# Patient Record
Sex: Male | Born: 1955 | Race: White | Hispanic: No | Marital: Married | Smoking: Never smoker
Health system: Southern US, Community
[De-identification: ages and names within clinical notes are randomized; demographics above are authoritative.]

## PROBLEM LIST (undated history)

## (undated) DIAGNOSIS — E785 Hyperlipidemia, unspecified: Secondary | ICD-10-CM

## (undated) DIAGNOSIS — D352 Benign neoplasm of pituitary gland: Secondary | ICD-10-CM

## (undated) DIAGNOSIS — G4730 Sleep apnea, unspecified: Secondary | ICD-10-CM

## (undated) DIAGNOSIS — T7840XA Allergy, unspecified, initial encounter: Secondary | ICD-10-CM

## (undated) DIAGNOSIS — F419 Anxiety disorder, unspecified: Secondary | ICD-10-CM

## (undated) DIAGNOSIS — K219 Gastro-esophageal reflux disease without esophagitis: Secondary | ICD-10-CM

## (undated) DIAGNOSIS — Z8639 Personal history of other endocrine, nutritional and metabolic disease: Secondary | ICD-10-CM

## (undated) DIAGNOSIS — I10 Essential (primary) hypertension: Secondary | ICD-10-CM

## (undated) DIAGNOSIS — N529 Male erectile dysfunction, unspecified: Secondary | ICD-10-CM

## (undated) DIAGNOSIS — D126 Benign neoplasm of colon, unspecified: Secondary | ICD-10-CM

## (undated) DIAGNOSIS — C439 Malignant melanoma of skin, unspecified: Secondary | ICD-10-CM

## (undated) HISTORY — PX: KNEE ARTHROSCOPY: SUR90

## (undated) HISTORY — DX: Personal history of other endocrine, nutritional and metabolic disease: Z86.39

## (undated) HISTORY — DX: Hyperlipidemia, unspecified: E78.5

## (undated) HISTORY — DX: Benign neoplasm of pituitary gland: D35.2

## (undated) HISTORY — DX: Malignant melanoma of skin, unspecified: C43.9

## (undated) HISTORY — DX: Male erectile dysfunction, unspecified: N52.9

## (undated) HISTORY — DX: Benign neoplasm of colon, unspecified: D12.6

## (undated) HISTORY — PX: WISDOM TOOTH EXTRACTION: SHX21

## (undated) HISTORY — DX: Essential (primary) hypertension: I10

## (undated) HISTORY — DX: Anxiety disorder, unspecified: F41.9

## (undated) HISTORY — DX: Gastro-esophageal reflux disease without esophagitis: K21.9

## (undated) HISTORY — DX: Sleep apnea, unspecified: G47.30

## (undated) HISTORY — DX: Allergy, unspecified, initial encounter: T78.40XA

## (undated) HISTORY — PX: SKIN LESION EXCISION: SHX2412

---

## 2002-05-27 DIAGNOSIS — D126 Benign neoplasm of colon, unspecified: Secondary | ICD-10-CM

## 2002-05-27 HISTORY — DX: Benign neoplasm of colon, unspecified: D12.6

## 2003-05-14 ENCOUNTER — Encounter: Payer: Self-pay | Admitting: Pulmonary Disease

## 2003-05-14 ENCOUNTER — Ambulatory Visit (HOSPITAL_BASED_OUTPATIENT_CLINIC_OR_DEPARTMENT_OTHER): Admission: RE | Admit: 2003-05-14 | Discharge: 2003-05-14 | Payer: Self-pay | Admitting: *Deleted

## 2003-07-09 ENCOUNTER — Ambulatory Visit (HOSPITAL_BASED_OUTPATIENT_CLINIC_OR_DEPARTMENT_OTHER): Admission: RE | Admit: 2003-07-09 | Discharge: 2003-07-09 | Payer: Self-pay | Admitting: *Deleted

## 2003-08-06 ENCOUNTER — Encounter: Payer: Self-pay | Admitting: Pulmonary Disease

## 2004-04-15 ENCOUNTER — Ambulatory Visit (HOSPITAL_BASED_OUTPATIENT_CLINIC_OR_DEPARTMENT_OTHER): Admission: RE | Admit: 2004-04-15 | Discharge: 2004-04-15 | Payer: Self-pay | Admitting: *Deleted

## 2004-04-15 ENCOUNTER — Encounter (INDEPENDENT_AMBULATORY_CARE_PROVIDER_SITE_OTHER): Payer: Self-pay | Admitting: Specialist

## 2004-04-15 ENCOUNTER — Ambulatory Visit (HOSPITAL_COMMUNITY): Admission: RE | Admit: 2004-04-15 | Discharge: 2004-04-15 | Payer: Self-pay | Admitting: *Deleted

## 2004-12-03 ENCOUNTER — Ambulatory Visit: Payer: Self-pay | Admitting: Family Medicine

## 2005-02-19 ENCOUNTER — Ambulatory Visit: Payer: Self-pay | Admitting: Family Medicine

## 2005-06-25 ENCOUNTER — Ambulatory Visit: Payer: Self-pay | Admitting: Gastroenterology

## 2005-07-09 ENCOUNTER — Ambulatory Visit: Payer: Self-pay | Admitting: Gastroenterology

## 2005-07-09 ENCOUNTER — Encounter (INDEPENDENT_AMBULATORY_CARE_PROVIDER_SITE_OTHER): Payer: Self-pay | Admitting: *Deleted

## 2005-11-23 ENCOUNTER — Ambulatory Visit: Payer: Self-pay | Admitting: Family Medicine

## 2005-12-16 ENCOUNTER — Ambulatory Visit: Payer: Self-pay | Admitting: Family Medicine

## 2005-12-27 HISTORY — PX: TONSILLECTOMY: SUR1361

## 2005-12-27 HISTORY — PX: SEPTOPLASTY: SUR1290

## 2006-05-11 ENCOUNTER — Ambulatory Visit (HOSPITAL_COMMUNITY): Admission: RE | Admit: 2006-05-11 | Discharge: 2006-05-12 | Payer: Self-pay | Admitting: Otolaryngology

## 2006-05-11 ENCOUNTER — Encounter (INDEPENDENT_AMBULATORY_CARE_PROVIDER_SITE_OTHER): Payer: Self-pay | Admitting: Specialist

## 2006-07-28 ENCOUNTER — Encounter: Payer: Self-pay | Admitting: Pulmonary Disease

## 2006-07-28 ENCOUNTER — Ambulatory Visit (HOSPITAL_BASED_OUTPATIENT_CLINIC_OR_DEPARTMENT_OTHER): Admission: RE | Admit: 2006-07-28 | Discharge: 2006-07-28 | Payer: Self-pay | Admitting: Otolaryngology

## 2006-07-30 ENCOUNTER — Ambulatory Visit: Payer: Self-pay | Admitting: Internal Medicine

## 2007-02-10 ENCOUNTER — Ambulatory Visit: Payer: Self-pay | Admitting: Family Medicine

## 2007-02-10 LAB — CONVERTED CEMR LAB
ALT: 25 units/L (ref 0–40)
AST: 27 units/L (ref 0–37)
BUN: 15 mg/dL (ref 6–23)
Basophils Absolute: 0 10*3/uL (ref 0.0–0.1)
Basophils Relative: 0.5 % (ref 0.0–1.0)
Calcium: 9.5 mg/dL (ref 8.4–10.5)
Creatinine, Ser: 0.9 mg/dL (ref 0.4–1.5)
GFR calc non Af Amer: 95 mL/min
HCT: 46.4 % (ref 39.0–52.0)
HDL: 41.1 mg/dL (ref >39.0)
Hemoglobin: 16 g/dL (ref 13.0–17.0)
LDL Cholesterol: 80 mg/dL (ref 0–99)
Lymphocytes Relative: 29.5 % (ref 12.0–46.0)
MCHC: 34.5 g/dL (ref 30.0–36.0)
Monocytes Absolute: 0.6 10*3/uL (ref 0.2–0.7)
Neutrophils Relative %: 56.7 % (ref 43.0–77.0)
Platelets: 271 10*3/uL (ref 150–400)
Potassium: 4 meq/L (ref 3.5–5.1)
Total Bilirubin: 0.9 mg/dL (ref 0.3–1.2)
Total Protein: 7.3 g/dL (ref 6.0–8.3)
VLDL: 13 mg/dL (ref 0–40)
WBC: 5.7 10*3/uL (ref 4.5–10.5)

## 2008-04-25 ENCOUNTER — Telehealth: Payer: Self-pay | Admitting: Family Medicine

## 2008-05-02 ENCOUNTER — Ambulatory Visit: Payer: Self-pay | Admitting: Family Medicine

## 2008-05-02 DIAGNOSIS — I10 Essential (primary) hypertension: Secondary | ICD-10-CM | POA: Insufficient documentation

## 2008-05-02 DIAGNOSIS — J309 Allergic rhinitis, unspecified: Secondary | ICD-10-CM

## 2008-05-02 DIAGNOSIS — K219 Gastro-esophageal reflux disease without esophagitis: Secondary | ICD-10-CM

## 2008-05-02 DIAGNOSIS — E785 Hyperlipidemia, unspecified: Secondary | ICD-10-CM | POA: Insufficient documentation

## 2008-07-16 ENCOUNTER — Ambulatory Visit: Payer: Self-pay | Admitting: Gastroenterology

## 2008-07-30 ENCOUNTER — Ambulatory Visit: Payer: Self-pay | Admitting: Gastroenterology

## 2008-12-06 ENCOUNTER — Ambulatory Visit: Payer: Self-pay | Admitting: Family Medicine

## 2008-12-06 DIAGNOSIS — J209 Acute bronchitis, unspecified: Secondary | ICD-10-CM

## 2009-03-24 ENCOUNTER — Encounter: Payer: Self-pay | Admitting: Family Medicine

## 2009-04-10 ENCOUNTER — Emergency Department (HOSPITAL_COMMUNITY): Admission: EM | Admit: 2009-04-10 | Discharge: 2009-04-10 | Payer: Self-pay | Admitting: Emergency Medicine

## 2009-05-28 ENCOUNTER — Encounter: Payer: Self-pay | Admitting: Pulmonary Disease

## 2009-06-20 ENCOUNTER — Ambulatory Visit: Payer: Self-pay | Admitting: Pulmonary Disease

## 2009-06-20 DIAGNOSIS — G4733 Obstructive sleep apnea (adult) (pediatric): Secondary | ICD-10-CM

## 2009-08-13 ENCOUNTER — Ambulatory Visit: Payer: Self-pay | Admitting: Family Medicine

## 2009-08-13 DIAGNOSIS — L738 Other specified follicular disorders: Secondary | ICD-10-CM

## 2009-08-13 DIAGNOSIS — E669 Obesity, unspecified: Secondary | ICD-10-CM

## 2009-08-13 DIAGNOSIS — F411 Generalized anxiety disorder: Secondary | ICD-10-CM

## 2009-08-15 ENCOUNTER — Ambulatory Visit: Payer: Self-pay | Admitting: Family Medicine

## 2009-08-15 LAB — CONVERTED CEMR LAB
Glucose, Urine, Semiquant: NEGATIVE
Specific Gravity, Urine: 1.02
WBC Urine, dipstick: NEGATIVE

## 2009-08-19 LAB — CONVERTED CEMR LAB
ALT: 22 units/L (ref 0–53)
Albumin: 4 g/dL (ref 3.5–5.2)
Basophils Absolute: 0 10*3/uL (ref 0.0–0.1)
CO2: 33 meq/L — ABNORMAL HIGH (ref 19–32)
Calcium: 9.1 mg/dL (ref 8.4–10.5)
Cholesterol: 124 mg/dL (ref 0–200)
Eosinophils Absolute: 0.1 10*3/uL (ref 0.0–0.7)
Eosinophils Relative: 2 % (ref 0.0–5.0)
HDL: 36.1 mg/dL — ABNORMAL LOW (ref >39.00)
Hemoglobin: 14.7 g/dL (ref 13.0–17.0)
Lymphs Abs: 1.5 10*3/uL (ref 0.7–4.0)
MCV: 92.8 fL (ref 78.0–100.0)
Monocytes Absolute: 0.6 10*3/uL (ref 0.1–1.0)
Neutrophils Relative %: 67.9 % (ref 43.0–77.0)
Potassium: 4.4 meq/L (ref 3.5–5.1)
RDW: 12.6 % (ref 11.5–14.6)
Sodium: 147 meq/L — ABNORMAL HIGH (ref 135–145)
Total Bilirubin: 0.9 mg/dL (ref 0.3–1.2)
Triglycerides: 85 mg/dL (ref 0.0–149.0)

## 2009-08-20 ENCOUNTER — Ambulatory Visit: Payer: Self-pay | Admitting: Family Medicine

## 2009-09-11 ENCOUNTER — Encounter: Payer: Self-pay | Admitting: Family Medicine

## 2009-09-11 ENCOUNTER — Encounter: Admission: RE | Admit: 2009-09-11 | Discharge: 2009-09-11 | Payer: Self-pay | Admitting: Family Medicine

## 2009-09-29 ENCOUNTER — Encounter: Payer: Self-pay | Admitting: Family Medicine

## 2010-01-03 ENCOUNTER — Encounter: Payer: Self-pay | Admitting: Pulmonary Disease

## 2010-01-15 ENCOUNTER — Telehealth: Payer: Self-pay | Admitting: Family Medicine

## 2010-01-15 ENCOUNTER — Encounter: Payer: Self-pay | Admitting: Pulmonary Disease

## 2010-02-20 ENCOUNTER — Telehealth: Payer: Self-pay | Admitting: Family Medicine

## 2010-06-19 ENCOUNTER — Ambulatory Visit: Payer: Self-pay | Admitting: Pulmonary Disease

## 2010-08-12 ENCOUNTER — Ambulatory Visit: Payer: Self-pay | Admitting: Family Medicine

## 2010-08-12 DIAGNOSIS — R079 Chest pain, unspecified: Secondary | ICD-10-CM | POA: Insufficient documentation

## 2010-08-13 ENCOUNTER — Ambulatory Visit: Payer: Self-pay | Admitting: Cardiovascular Disease

## 2010-08-14 LAB — CONVERTED CEMR LAB
Alkaline Phosphatase: 61 units/L (ref 39–117)
Basophils Absolute: 0 10*3/uL (ref 0.0–0.1)
Bilirubin, Direct: 0.2 mg/dL (ref 0.0–0.3)
Chloride: 103 meq/L (ref 96–112)
Creatinine, Ser: 1.1 mg/dL (ref 0.4–1.5)
Eosinophils Absolute: 0.1 10*3/uL (ref 0.0–0.7)
Glucose, Bld: 74 mg/dL (ref 70–99)
HCT: 43.3 % (ref 39.0–52.0)
Hemoglobin: 14.8 g/dL (ref 13.0–17.0)
Lymphs Abs: 1.3 10*3/uL (ref 0.7–4.0)
MCHC: 34.1 g/dL (ref 30.0–36.0)
Neutrophils Relative %: 57.3 % (ref 43.0–77.0)
Platelets: 229 10*3/uL (ref 150.0–400.0)
Sodium: 144 meq/L (ref 135–145)
TSH: 2.04 microintl units/mL (ref 0.35–5.50)
Total Bilirubin: 0.6 mg/dL (ref 0.3–1.2)
Total Protein: 6.5 g/dL (ref 6.0–8.3)
WBC: 5.4 10*3/uL (ref 4.5–10.5)

## 2010-11-30 ENCOUNTER — Ambulatory Visit: Payer: Self-pay | Admitting: Family Medicine

## 2011-01-24 LAB — CONVERTED CEMR LAB
ALT: 19 units/L (ref 0–53)
AST: 21 units/L (ref 0–37)
Bilirubin, Direct: 0.1 mg/dL (ref 0.0–0.3)
Blood in Urine, dipstick: NEGATIVE
Chloride: 106 meq/L (ref 96–112)
Cholesterol: 198 mg/dL (ref 0–200)
Eosinophils Absolute: 0.2 10*3/uL (ref 0.0–0.7)
GFR calc non Af Amer: 75 mL/min
Glucose, Bld: 81 mg/dL (ref 70–99)
Hemoglobin: 15.6 g/dL (ref 13.0–17.0)
LDL Cholesterol: 148 mg/dL — ABNORMAL HIGH (ref 0–99)
Lymphocytes Relative: 23.4 % (ref 12.0–46.0)
Monocytes Relative: 10.2 % (ref 3.0–12.0)
Neutro Abs: 4.2 10*3/uL (ref 1.4–7.7)
PSA: 0.18 ng/mL (ref 0.10–4.00)
Platelets: 241 10*3/uL (ref 150–400)
Potassium: 4.2 meq/L (ref 3.5–5.1)
RBC: 5.01 M/uL (ref 4.22–5.81)
RDW: 12.5 % (ref 11.5–14.6)
Sodium: 143 meq/L (ref 135–145)
Specific Gravity, Urine: 1.015
TSH: 1.3 microintl units/mL (ref 0.35–5.50)
Total Bilirubin: 1.2 mg/dL (ref 0.3–1.2)
Total CHOL/HDL Ratio: 5.5
Total Protein: 7.2 g/dL (ref 6.0–8.3)
Triglycerides: 70 mg/dL (ref 0–149)
WBC Urine, dipstick: NEGATIVE

## 2011-01-26 NOTE — Progress Notes (Signed)
Summary: med refill  Phone Note Call from Patient Call back at Home Phone [phone]   Summary of Call: pt needs additional  refills  on sertraline 100 mg call into [facility] [phone] Initial call taken by: Heron Sabins,  January 15, 2010 9:18 AM  Follow-up for Phone Call        call in #30 with 11 rf Follow-up by: Nelwyn Salisbury MD,  January 15, 2010 12:30 PM  Additional Follow-up for Phone Call Additional follow up Details #1::        Phone Call Completed, Rx Called In Additional Follow-up by: Alfred Levins, CMA,  January 15, 2010 12:39 PM    Prescriptions: ZOLOFT 100 MG TABS (SERTRALINE HCL) once daily  #30 x 11   Entered by:   Alfred Levins, CMA   Authorized by:   Nelwyn Salisbury MD   Signed by:   Alfred Levins, CMA on 01/15/2010   Method used:   Electronically to        [facility] (retail)       [address]       [place], [place]  [address]       Ph: [phone]       Fax: [phone]   RxID:   [id number]

## 2011-01-26 NOTE — Miscellaneous (Signed)
Summary: optimal pressure 13cm  Clinical Lists Changes  Orders: Added new Referral order of DME Referral (DME) - Signed auto shows good compliance, optimal pressure 13-14cm

## 2011-01-26 NOTE — Assessment & Plan Note (Signed)
Summary: cramping in neck and legs/hurts to take deep breaths/pain in ...   Vital Signs:  Patient profile:   55 year old male Weight:      246 pounds BMI:     36.46 O2 Sat:      92 % on Room air Temp:     98.6 degrees F oral BP sitting:   112 / 72  (left arm) Cuff size:   regular  Vitals Entered By: Raechel Ache, RN (August 12, 2010 11:47 AM)  O2 Flow:  Room air CC: C/o L chest hurting under ribs past few weeks with deep breath and now L shoulder blade area hurts- feels tired and neck, calf muscles tight.   History of Present Illness: Here for 3 weeks of sharp pains in the left middle back and left chest area. No SOB but it hurts to take a deep breath. No fever or cough. No nausea or heartburn. No rashes. No recent trauma. he is concerned because he had a melanoma removed from  the left upper back.  Allergies: No Known Drug Allergies  Past History:  Past Medical History: Reviewed history from 08/20/2009 and no changes required. Allergic rhinitis GERD Hyperlipidemia sleep apnea, wears CPAP at night, has seen Dr. Shelle Iron hypotestosteronemia ED Hypertension melanoma, sees Dr. Terri Piedra for skin checks anxiety, sees Clerance Lav NP   Past Surgical History: Reviewed history from 08/20/2009 and no changes required. Knee arthroscopy Colonoscopy 07-30-08 per Dr. Russella Dar, repeat in 5 yrs septoplasty, tonsillectomy, and UPP per Dr. Annalee Genta 2007  Review of Systems  The patient denies anorexia, fever, weight loss, weight gain, vision loss, decreased hearing, hoarseness, syncope, dyspnea on exertion, peripheral edema, prolonged cough, headaches, hemoptysis, abdominal pain, melena, hematochezia, severe indigestion/heartburn, hematuria, incontinence, genital sores, muscle weakness, suspicious skin lesions, transient blindness, difficulty walking, depression, unusual weight change, abnormal bleeding, enlarged lymph nodes, angioedema, breast masses, and testicular masses.    Physical  Exam  General:  Well-developed,well-nourished,in no acute distress; alert,appropriate and cooperative throughout examination Neck:  No deformities, masses, or tenderness noted. Chest Wall:  tender along the left chest area just below the nipple line. This tenderness extends from the anterior chest around the flank to the scapula.  Lungs:  Normal respiratory effort, chest expands symmetrically. Lungs are clear to auscultation, no crackles or wheezes. Heart:  Normal rate and regular rhythm. S1 and S2 normal without gallop, murmur, click, rub or other extra sounds. Msk:  tender in the left upper back from the scapula to the spine. No masses.  Skin:  Intact without suspicious lesions or rashes   Impression & Recommendations:  Problem # 1:  CHEST PAIN (ICD-786.50)  Orders: Venipuncture ([address]109) TLB-BMP (Basic Metabolic Panel-BMET) (80048-METABOL) TLB-CBC Platelet - w/Differential (85025-CBCD) TLB-Hepatic/Liver Function Pnl (80076-HEPATIC) TLB-TSH (Thyroid Stimulating Hormone) (60454-UJW) T-D-Dimer Fibrin Derivatives Quantitive (11914-78295) Specimen Handling (62130) Radiology Referral (Radiology)  Complete Medication List: 1)  Lipitor 40 Mg Tabs (Atorvastatin calcium) .... Take 1 tab by mouth daily 2)  Multivitamins Tabs (Multiple vitamin) .Marland Kitchen.. 1 by mouth once daily 3)  Fish Oil Oil (Fish oil) .Marland Kitchen.. 1 by mouth once daily 4)  Zoloft 100 Mg Tabs (Sertraline hcl) .... Once daily  Patient Instructions: 1)  This is likely musculoskeletal pain, but we are both concerned about his hx of melanoma. We need to rule out PE, etc. also. Get labs and set up a contrasted chest CT Prescriptions: LIPITOR 40 MG TABS (ATORVASTATIN CALCIUM) Take 1 tab by mouth daily  #90 x 3  Entered and Authorized by:   Nelwyn Salisbury MD   Signed by:   Nelwyn Salisbury MD on 08/12/2010   Method used:   Print then Give to Patient   RxID:   [id number]

## 2011-01-26 NOTE — Assessment & Plan Note (Signed)
Summary: rov for osa   Copy to:  Dr. Annalee Genta Primary Provider/Referring Provider:  Clent Ridges  CC:  Pt is here for a 1 yr f/u appt on her OSA.   Pt states he is wearing his cpap machine every night.  Approx 8 hours per night.  Pt states he was prev on a nasal mask but DME company recently sent him a full face mask.  pt states he has noticed more air leaks with full face mask and pressure may need to be adjusted as well.  pt also c/o nasal congestion. Marland Kitchen  History of Present Illness: The pt comes in today for f/u of his known osa.  He is wearing cpap compliantly everynight, and feels that he is sleeping well, and is satisfied with his daytime alertness.  He is having no issues with his machine, and is keeping up with the general maintenance.  He normally uses nasal pillows, but his dme recently sent him a full face mask which he thinks may be more comfortable.  However, he wonders if there is a different type that isn't as prone to leaks as the one he got.  Current Medications (verified): 1)  Lipitor 40 Mg Tabs (Atorvastatin Calcium) .... Take 1 Tab By Mouth Daily 2)  Multivitamins   Tabs (Multiple Vitamin) .Marland Kitchen.. 1 By Mouth Once Daily 3)  Fish Oil   Oil (Fish Oil) .Marland Kitchen.. 1 By Mouth Once Daily 4)  Zoloft 100 Mg Tabs (Sertraline Hcl) .... Once Daily  Allergies (verified): No Known Drug Allergies  Review of Systems       The patient complains of nasal congestion/difficulty breathing through nose.  The patient denies shortness of breath with activity, shortness of breath at rest, productive cough, non-productive cough, coughing up blood, chest pain, irregular heartbeats, acid heartburn, indigestion, loss of appetite, weight change, abdominal pain, difficulty swallowing, sore throat, tooth/dental problems, headaches, sneezing, itching, ear ache, anxiety, depression, hand/feet swelling, joint stiffness or pain, rash, change in color of mucus, and fever.    Vital Signs:  Patient profile:   55 year old  male Height:      69 inches Weight:      246 pounds BMI:     36.46 O2 Sat:      96 % on Room air Temp:     98.1 degrees F oral Pulse rate:   72 / minute BP sitting:   102 / 60  (left arm) Cuff size:   large  Vitals Entered By: Arman Filter LPN (June 19, 2010 3:34 PM)  O2 Flow:  Room air CC: Pt is here for a 1 yr f/u appt on her OSA.   Pt states he is wearing his cpap machine every night.  Approx 8 hours per night.  Pt states he was prev on a nasal mask but DME company recently sent him a full face mask.  pt states he has noticed more air leaks with full face mask and pressure may need to be adjusted as well.  pt also c/o nasal congestion.  Comments Medications reviewed with patient Arman Filter LPN  June 19, 2010 3:34 PM    Physical Exam  General:  ow male in nad  Nose:  patent without discharge, no skin breakdown or pressure necrosis from cpap mask Extremities:  no significant edema or cyanosis Neurologic:  alert and oriented, doesn't appear sleepy, moves all 4.   Impression & Recommendations:  Problem # 1:  OBSTRUCTIVE SLEEP APNEA (ICD-327.23) the pt is doing well with  cpap, and feels that he is sleeping well with normal daytime alertness.  He would like to consider changing to a full face mask, and I will have his dme show him various types that may fit him better.  I have also encouraged him to work aggressively on weight loss.  Other Orders: Est. Patient Level III ([address]109) DME Referral (DME)  Patient Instructions: 1)  work on weight loss 2)  followup with me in one year 3)  will send an order to dme to show you different full face masks   Immunization History:  Influenza Immunization History:    Influenza:  historical (12/27/2009)

## 2011-01-26 NOTE — Progress Notes (Signed)
Summary: REQ FOR MED REFILL  Phone Note Call from Patient   Caller: Patient [phone] Reason for Call: Refill Medication Summary of Call: Pt req that a refill on meds: Setraline / Lipitor be sent in to [facility]...Marland KitchenMarland Kitchen Pt adv that he would contact [facility] to have them send a refill request to [facility] but he would like Dr Clent Ridges / Arline Asp, CMA know that same was requested.  Initial call taken by: Debbra Riding,  February 20, 2010 2:33 PM  Follow-up for Phone Call        Phone Call Completed, Rx Called In Follow-up by: Alfred Levins, CMA,  February 20, 2010 3:49 PM    Prescriptions: ZOLOFT 100 MG TABS (SERTRALINE HCL) once daily  #30 x 11   Entered by:   Alfred Levins, CMA   Authorized by:   Nelwyn Salisbury MD   Signed by:   Alfred Levins, CMA on 02/20/2010   Method used:   Electronically to        [facility] (retail)       [address]       [place], [place]  [address]       Ph: [phone]       Fax: [phone]   RxID:   [id number] LIPITOR 40 MG TABS (ATORVASTATIN CALCIUM) Take 1 tab by mouth daily  #30 x 11   Entered by:   Alfred Levins, CMA   Authorized by:   Nelwyn Salisbury MD   Signed by:   Alfred Levins, CMA on 02/20/2010   Method used:   Electronically to        [facility] (retail)       [address]       [place], [place]  [address]       Ph: [phone]       Fax: [phone]   RxID:   [id number]

## 2011-01-26 NOTE — Assessment & Plan Note (Signed)
Summary: productive cough.sob/dm   Vital Signs:  Patient profile:   55 year old male Weight:      242 pounds O2 Sat:      98 % Temp:     98.4 degrees F BP sitting:   120 / 84  (left arm) Cuff size:   large  Vitals Entered By: Pura Spice, RN (November 30, 2010 1:37 PM) CC: cough congestion sore throat   History of Present Illness: Here for 3 days of PND, ST, chest congestion, and coughing up green sputum. No fever.   Allergies (verified): No Known Drug Allergies  Past History:  Past Medical History: Reviewed history from 08/20/2009 and no changes required. Allergic rhinitis GERD Hyperlipidemia sleep apnea, wears CPAP at night, has seen Dr. Shelle Iron hypotestosteronemia ED Hypertension melanoma, sees Dr. Terri Piedra for skin checks anxiety, sees Clerance Lav NP   Review of Systems  The patient denies anorexia, fever, weight loss, weight gain, vision loss, decreased hearing, hoarseness, chest pain, syncope, dyspnea on exertion, peripheral edema, headaches, hemoptysis, abdominal pain, melena, hematochezia, severe indigestion/heartburn, hematuria, incontinence, genital sores, muscle weakness, suspicious skin lesions, transient blindness, difficulty walking, depression, unusual weight change, abnormal bleeding, enlarged lymph nodes, angioedema, breast masses, and testicular masses.    Physical Exam  General:  Well-developed,well-nourished,in no acute distress; alert,appropriate and cooperative throughout examination Head:  Normocephalic and atraumatic without obvious abnormalities. No apparent alopecia or balding. Eyes:  No corneal or conjunctival inflammation noted. EOMI. Perrla. Funduscopic exam benign, without hemorrhages, exudates or papilledema. Vision grossly normal. Ears:  External ear exam shows no significant lesions or deformities.  Otoscopic examination reveals clear canals, tympanic membranes are intact bilaterally without bulging, retraction, inflammation or  discharge. Hearing is grossly normal bilaterally. Nose:  External nasal examination shows no deformity or inflammation. Nasal mucosa are pink and moist without lesions or exudates. Mouth:  Oral mucosa and oropharynx without lesions or exudates.  Teeth in good repair. Neck:  No deformities, masses, or tenderness noted. Lungs:  Normal respiratory effort, chest expands symmetrically. Lungs are clear to auscultation, no crackles or wheezes.   Impression & Recommendations:  Problem # 1:  ACUTE BRONCHITIS (ICD-466.0)  His updated medication list for this problem includes:    Zithromax Z-pak 250 Mg Tabs (Azithromycin) .Marland Kitchen... As directed  Complete Medication List: 1)  Lipitor 40 Mg Tabs (Atorvastatin calcium) .... Take 1 tab by mouth daily 2)  Multivitamins Tabs (Multiple vitamin) .Marland Kitchen.. 1 by mouth once daily 3)  Fish Oil Oil (Fish oil) .Marland Kitchen.. 1 by mouth once daily 4)  Zoloft 100 Mg Tabs (Sertraline hcl) .... Once daily 5)  Zithromax Z-pak 250 Mg Tabs (Azithromycin) .... As directed  Patient Instructions: 1)  Please schedule a follow-up appointment as needed .  Prescriptions: ZITHROMAX Z-PAK 250 MG TABS (AZITHROMYCIN) as directed  #1 x 0   Entered and Authorized by:   Nelwyn Salisbury MD   Signed by:   Nelwyn Salisbury MD on 11/30/2010   Method used:   Electronically to        [facility] (retail)       [address]       [place], [place]  [address]       Ph: [phone]       Fax: [phone]   RxID:   [id number]    Orders Added: 1)  Est. Patient Level IV [44034]

## 2011-01-26 NOTE — Miscellaneous (Signed)
Summary: CMN for CPAP Supplies/[facility]  CMN for CPAP Supplies/[facility]   Imported By: Sherian Rein 01/22/2010 09:43:21  _____________________________________________________________________  External Attachment:    Type:   Image     Comment:   External Document

## 2011-02-28 ENCOUNTER — Other Ambulatory Visit: Payer: Self-pay | Admitting: Family Medicine

## 2011-02-28 DIAGNOSIS — F411 Generalized anxiety disorder: Secondary | ICD-10-CM

## 2011-03-15 ENCOUNTER — Encounter: Payer: Self-pay | Admitting: Family Medicine

## 2011-03-30 ENCOUNTER — Other Ambulatory Visit (INDEPENDENT_AMBULATORY_CARE_PROVIDER_SITE_OTHER): Payer: PRIVATE HEALTH INSURANCE | Admitting: Family Medicine

## 2011-03-30 DIAGNOSIS — Z0000 Encounter for general adult medical examination without abnormal findings: Secondary | ICD-10-CM

## 2011-03-30 LAB — CBC WITH DIFFERENTIAL/PLATELET
Basophils Absolute: 0 10*3/uL (ref 0.0–0.1)
Basophils Relative: 0.5 % (ref 0.0–3.0)
Eosinophils Absolute: 0.2 10*3/uL (ref 0.0–0.7)
Eosinophils Relative: 3.5 % (ref 0.0–5.0)
Lymphocytes Relative: 32.6 % (ref 12.0–46.0)
Lymphs Abs: 1.7 10*3/uL (ref 0.7–4.0)
MCHC: 34 g/dL (ref 30.0–36.0)
Monocytes Absolute: 0.5 10*3/uL (ref 0.1–1.0)
Neutro Abs: 2.8 10*3/uL (ref 1.4–7.7)
RDW: 14.4 % (ref 11.5–14.6)
WBC: 5.2 10*3/uL (ref 4.5–10.5)

## 2011-03-30 LAB — POCT URINALYSIS DIPSTICK: Urobilinogen, UA: 0.2

## 2011-03-31 LAB — BASIC METABOLIC PANEL
CO2: 28 mEq/L (ref 19–32)
Calcium: 9 mg/dL (ref 8.4–10.5)
Potassium: 4.7 mEq/L (ref 3.5–5.1)
Sodium: 140 mEq/L (ref 135–145)

## 2011-03-31 LAB — PSA: PSA: 0.12 ng/mL (ref 0.10–4.00)

## 2011-03-31 LAB — HEPATIC FUNCTION PANEL
AST: 26 U/L (ref 0–37)
Alkaline Phosphatase: 55 U/L (ref 39–117)

## 2011-03-31 LAB — LIPID PANEL
LDL Cholesterol: 91 mg/dL (ref 0–99)
Triglycerides: 48 mg/dL (ref 0.0–149.0)
VLDL: 9.6 mg/dL (ref 0.0–40.0)

## 2011-04-01 ENCOUNTER — Telehealth: Payer: Self-pay

## 2011-04-01 NOTE — Telephone Encounter (Signed)
Message copied by Madison Hickman on Thu Apr 01, 2011  2:38 PM ------      Message from: Dwaine Deter      Created: Thu Apr 01, 2011  1:23 PM       normal

## 2011-04-01 NOTE — Telephone Encounter (Signed)
Pt aware of lab results 

## 2011-04-06 ENCOUNTER — Encounter: Payer: Self-pay | Admitting: Family Medicine

## 2011-04-06 ENCOUNTER — Ambulatory Visit (INDEPENDENT_AMBULATORY_CARE_PROVIDER_SITE_OTHER): Payer: PRIVATE HEALTH INSURANCE | Admitting: Family Medicine

## 2011-04-06 VITALS — BP 134/82 | HR 72 | Ht 72.0 in | Wt 229.0 lb

## 2011-04-06 DIAGNOSIS — F411 Generalized anxiety disorder: Secondary | ICD-10-CM

## 2011-04-06 DIAGNOSIS — Z0000 Encounter for general adult medical examination without abnormal findings: Secondary | ICD-10-CM

## 2011-04-06 DIAGNOSIS — Z136 Encounter for screening for cardiovascular disorders: Secondary | ICD-10-CM

## 2011-04-06 MED ORDER — SERTRALINE HCL 100 MG PO TABS: 100.0000 mg | ORAL_TABLET | Freq: Every day | ORAL | Status: DC

## 2011-04-06 MED ORDER — ATORVASTATIN CALCIUM 40 MG PO TABS: 40.0000 mg | ORAL_TABLET | Freq: Every day | ORAL | Status: DC

## 2011-04-06 NOTE — Progress Notes (Signed)
  Subjective:    Patient ID: Nicholas Moore, male    DOB: 1956/09/28, 55 y.o.   MRN: [id number]  HPI 55 yr old male for a cpx. He feels great and has no complaints. He is dieting and working out with a trainer several days a week.    Review of Systems  Constitutional: Negative.   HENT: Negative.   Eyes: Negative.   Respiratory: Negative.   Cardiovascular: Negative.   Gastrointestinal: Negative.   Genitourinary: Negative.   Musculoskeletal: Negative.   Skin: Negative.   Neurological: Negative.   Hematological: Negative.   Psychiatric/Behavioral: Negative.        Objective:   Physical Exam  Constitutional: He is oriented to person, place, and time. He appears well-developed and well-nourished. No distress.  HENT:  Head: Normocephalic and atraumatic.  Right Ear: External ear normal.  Left Ear: External ear normal.  Nose: Nose normal.  Mouth/Throat: Oropharynx is clear and moist. No oropharyngeal exudate.  Eyes: Conjunctivae and EOM are normal. Pupils are equal, round, and reactive to light. Right eye exhibits no discharge. Left eye exhibits no discharge. No scleral icterus.  Neck: Neck supple. No JVD present. No tracheal deviation present. No thyromegaly present.  Cardiovascular: Normal rate, regular rhythm, normal heart sounds and intact distal pulses.  Exam reveals no gallop and no friction rub.   No murmur heard.      EKG normal   Pulmonary/Chest: Effort normal and breath sounds normal. No respiratory distress. He has no wheezes. He has no rales. He exhibits no tenderness.  Abdominal: Soft. Bowel sounds are normal. He exhibits no distension and no mass. There is no tenderness. There is no rebound and no guarding.  Genitourinary: Rectum normal, prostate normal and penis normal. Guaiac negative stool. No penile tenderness.  Musculoskeletal: Normal range of motion. He exhibits no edema and no tenderness.  Lymphadenopathy:    He has no cervical adenopathy.  Neurological: He is  alert and oriented to person, place, and time. He has normal reflexes. No cranial nerve deficit. He exhibits normal muscle tone. Coordination normal.  Skin: Skin is warm and dry. No rash noted. He is not diaphoretic. No erythema. No pallor.  Psychiatric: He has a normal mood and affect. His behavior is normal. Judgment and thought content normal.          Assessment & Plan:  Continue the weight loss efforts.

## 2011-05-14 NOTE — Op Note (Signed)
NAME:  DOYEL, MULKERN NO.:  [id number]   MEDICAL RECORD NO.:  [id number]                   PATIENT TYPE:  AMB   LOCATION:  DSC                                  FACILITY:  [facility]   PHYSICIAN:  Vikki Ports, M.D.         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   PROCEDURE:  Wide excision of malignant melanoma.   SURGEON:  Vikki Ports, M.D.   ANESTHESIA:  General.   DESCRIPTION OF PROCEDURE:  The patient was taken to the operating room and  placed in the supine position.  After adequate general anesthesia was  induced, using endotracheal tube, the patient was placed in the right  lateral decubitus position.  The left shoulder was prepped and draped in the  normal sterile fashion.  Elliptical incision was made out to at least 1 cm  in all directions, dissecting down to the subcutaneous fat.  This was  removed en bloc.  Margins were marked and it was sent for pathologic  evaluation.  Flaps were created, both superiorly and inferiorly and complex  closure was performed with interrupted staples and vertical mattress 2-0  nylon sutures.  Sterile dressing was applied.   The patient tolerated the procedure well and went to the PACU in good  condition.                                               Vikki Ports, M.D.    KRH/MEDQ  D:  04/15/2004  T:  04/15/2004  Job:  [id number]

## 2011-05-14 NOTE — Assessment & Plan Note (Signed)
[facility] OFFICE NOTE   NAME:Nicholas Moore, Nicholas Moore                        MRN:          [id number]  DATE:02/10/2007                            DOB:          1956/03/25    This is a 55 year old gentleman here for a complete physical  examination. In general, he is doing well and has no particular  complaints. He continues to be treated for sleep apnea. He continues to  wear a CPAP machine every night when he goes to bed; however, last  summer he did have a septoplasty, a tonsillectomy, and a  uvulopalatopharyngoplasty performed by Dr. Annalee Genta. Loraine Leriche is not really  sure if this has improved his sleep quality or not, although he is told  by his wife that he snores less than he used to. He continues exercise  on  regular basis.  A year ago he had tried AndroGel briefly for mildly  low testosterone levels; he does not wish to continue this. He had tried  some Cialis and was pleased with the results. He would like to have  refills for it. I had had him on Altace for a while for hypertension. He  took himself off of this about a year ago and thinks that because of his  exercise program his blood pressures are now adequately managed. He did  have an unremarkable colonoscopy in July of 2006. For other details of  his past medical history, family history, social history, habits etc, I  refer you to our last physical note dated December 17, 2005.   ALLERGIES:  None.   CURRENT MEDICATIONS:  1. Lipitor 40 mg per day.  2. Fluticasone nasal sprays daily.  3. Multivitamins daily.  4. Aspirin 81 mg per day.  5. Fish oil daily.  6. Cialis 20 mg as needed.  7. CPAP at night.   OBJECTIVE:  Height 5 feet 9 inches, weight 223, blood pressure 102/68,  pulse 72 and regular.  IN GENERAL: He appears to be healthy.  SKIN: Clear.  EYES: Clear.  He wears glasses.  EARS: Clear.  PHARYNX: Clear.  NECK: Supple without lymphadenopathy or  masses.  LUNGS: Clear.  CARDIAC: Rate and rhythm regular without gallops, murmurs, or rubs.  Distal pulses are full.  ABDOMEN: Soft, normal bowel sounds, nontender, no masses.  GENITALIA: Normal male.  He is circumcised.  RECTAL EXAM: No mass or tenderness.  Stool Hemoccult negative.  EXTREMITIES: No clubbing, cyanosis or edema.  NEUROLOGIC EXAM: Grossly intact.   EKG is within normal limits.   ASSESSMENT/PLAN:  1. Complete physical.  We will get the usual laboratories today.  2. Hyperlipidemia.  We will get a fasting lipid panel today.  3. Hypertension.  Stable off of medications.  I encouraged him to      continue his exercise routine.  4. Erectile dysfunction.  Stable.  5. Sleep apnea.  Stable.     Tera Mater. Clent Ridges, MD  Electronically Signed    SAF/MedQ  DD: 02/10/2007  DT: 02/10/2007  Job #: [id number]

## 2011-05-14 NOTE — Procedures (Signed)
NAME:  Nicholas Moore, AUXIER NO.:  [id number]   MEDICAL RECORD NO.:  [id number]          PATIENT TYPE:  OUT   LOCATION:  SLEEP CENTER                 FACILITY:  [facility]   PHYSICIAN:  Clinton D. Maple Hudson, M.D. DATE OF BIRTH:  1956-03-16   DATE OF STUDY:  07/28/2006                              NOCTURNAL POLYSOMNOGRAM   SLEEP CENTER REPORT   REFERRING PHYSICIAN:  Dr. Osborn Coho.   INDICATIONS FOR STUDY:  Hypersomnia with sleep apnea.  Epworth sleepiness  score 15/24, BMI 30.  Weight 215 pounds.  Home medication Lipitor and  Altace.  A previous baseline diagnostic NPSG on May 14, 2003 had recorded an  AHI of 68 per hour.  Subsequent CPAP titration on  July 09, 2003 recorded  CPAP titration to 12 CWP.  A split study protocol was requested for this  study.   SLEEP ARCHITECTURE:  Total sleep time 273 minutes with sleep efficiency 65%.  Stage I was 10%, stage II 82%, stages III and IV were absent, REM 8% of  total sleep time.  Sleep latency 23 minutes, REM latency 304 minutes, awake  after sleep onset 122 minutes, arousal index 35.3.  Sleep onset was at 10:30  p.m. with prolonged awake time after 23:45 p.m. until nearly 2:00 a.m.  Ambien was taken at 00:35 a.m.  The patient slept with a mouthpiece for  bruxism.   RESPIRATORY DATA:  Apnea/hypopnea index (AHI, RDI) 15.4 obstructive events  per hour.  This included 12 obstructive apneas and 58 hypopneas.  Events  were strongly positional, mostly recorded while supine.  REM AHI 0.  Because  of insufficient sleep and accumulated events during the first hours of the  night, CPAP titration could not be provided by protocol.   OXYGEN DATA:  Mild to moderate snoring with oxygen desaturation to a nadir  of 89%.  Mean oxygen saturation through the study was 94% on room air.   CARDIAC DATA:  Sinus rhythm with heart rate 47-52 beats per minute.   MOVEMENT/PARASOMNIA:  A total of 150 limb jerks were recorded of which 40  were  associated with arousal or awakening for periodic limb movement with  arousal index of 8.8 per hour which is increased.  Bathroom x2.  The patient  complained of nausea during 1 of the bathroom trips.   IMPRESSION/RECOMMENDATIONS:  1.  Mild to moderate obstructive sleep apnea/hypopnea syndrome,      apnea/hypopnea index 15.4 per hour with positional events noted mostly      while supine.  Mild to moderate snoring with oxygen desaturation to a      nadir of 89%.  2.  The patient was wearing an oral appliance described as a bruxism      mouthpiece.  3.  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  4.  Note complaint of nausea during 1 bathroom trip, unexplained.  5.  Baseline diagnostic NPSG on May 14, 2003 had recorded an apnea/hypopnea      index of 68 per hour with subsequent continuous positive airway pressure      titration  to 12 CWP.      Clinton D. Maple Hudson, M.D.  Diplomate, Biomedical engineer of Sleep Medicine  Electronically Signed     CDY/MEDQ  D:  07/30/2006 12:20:01  T:  07/30/2006 13:13:31  Job:  [id number]

## 2011-05-14 NOTE — Op Note (Signed)
NAME:  Nicholas Moore, Nicholas Moore                 ACCOUNT NO.:  [id number]   MEDICAL RECORD NO.:  [id number]          PATIENT TYPE:  AMB   LOCATION:  SDS                          FACILITY:  [facility]   PHYSICIAN:  Kinnie Scales. Annalee Genta, M.D.DATE OF BIRTH:  Dec 10, 1956   DATE OF PROCEDURE:  05/11/2006  DATE OF DISCHARGE:                                 OPERATIVE REPORT   PREOPERATIVE DIAGNOSIS:  1.  Obstructive sleep apnea.  2.  Deviated nasal septum.  3.  Uvula palatal and tonsillar hypertrophy.  4.  Inferior turbinate hypertrophy.   POSTOPERATIVE DIAGNOSIS:  1.  Obstructive sleep apnea.  2.  Deviated nasal septum.  3.  Uvula palatal and tonsillar hypertrophy.  4.  Inferior turbinate hypertrophy.   SURGICAL PROCEDURE:  1.  Uvulopalatopharyngoplasty.  2.  Nasal septoplasty.  3.  Tonsillectomy.  4.  Bilateral inferior turbinate reduction.   ANESTHESIA:  General endotracheal.   SURGEON:  Kinnie Scales. Annalee Genta, M.D.   COMPLICATIONS:  None.   BLOOD LOSS:  Approximately 50 mL.   DISPOSITION:  The patient was transferred from the operating room to the  recovery room in stable condition   BRIEF HISTORY:  Mr. Nicholas Moore is a 55 year old white male who is referred for  evaluation of obstructive sleep apnea.  The patient has moderately severe  sleep apnea and has been attempting to use a CPAP device on a long-standing  basis for airway control.  Unfortunately, despite this treatment, the  patient has continued to have significant problems of daytime fatigue,  hypersomnolence, and apnea symptoms, and has had difficulty wearing his CPAP  consistently. He has been treated with topical nasal steroids,  antihistamines, oral steroids and has had limited improvement. Examination  in the office including flexible endoscopy was performed and showed deviated  septum with nasal airway obstruction, turbinate hypertrophy, tonsillar and  uvulopalatal hypertrophy.  Given the patient's history of apnea and physical  findings with failure to respond to CPAP, I have recommended that we  undertake the above surgical procedures consisting of nasal septoplasty,  turbinate reduction, uvulopalatopharyngoplasty and tonsillectomy.  The risk,  benefits, and possible complications of the surgical procedure were  discussed in detail and the patient understood and concurred with our plan  for surgery which is scheduled at [facility].   SURGICAL PROCEDURES:  The patient was brought to the operating room on May 11, 2006, and placed in the supine position on the operating table.  General  endotracheal anesthesia was established without difficulty.  The patient was  adequately anesthetized, positioned on the operating table, and prepped and  draped in a sterile fashion.  The procedure was begun by injecting the  patient's nasal passageway with 10 mL of 1% lidocaine with 1:100,000  epinephrine injected in a submucosal fashion along the nasal septum and  inferior turbinates bilaterally.  The patient's nose was then packed with  Afrin soaked cottonoid pledget left in place for approximately 10 minutes to  allow for vasoconstriction and hemostasis.   The nasal surgery was begun with the nasal septoplasty.  A right  hemitransfixion  incision was created through the anterior septum and carried  through the mucosa and underlying submucosa and a mucoperichondrial flap was  elevated from anterior to posterior along the patient's right-hand side.  Bony cartilaginous junction was crossed at the midline and mucoperiosteal  flap was elevated on the left.  Because of a severely deviated nasal septum  with septal spurring and a large bone spur along the inferior aspect of the  left nasal cavity, the overlying mucosa was mobilized and bone spur was  resected using a 4 mm osteotome to bring the nasal tissue to the midline.  Deviated bone and cartilage in the mid and posterior aspect of the nasal  septum were  resected. Anterior, dorsal, and columellar cartilage was not  significantly deviated and this was left intact.  The anterior cartilage was  morselized and returned to the mucoperichondrial pocket at the conclusion of  the surgical procedure.  The mucoperichondrial flaps were reapproximated  with a 4-0 gut suture on a Keith needle in a horizontal mattress fashion.  Bilateral Doyle nasal septal splints were then placed after the application  of Bactroban ointment and were sutured individually with a 3-0 Ethilon  suture.   Bilateral inferior turbinate reduction was performed with cautery set 12  watts.  Two submucosal passes were made in each inferior turbinate.  When  the turbinates were adequately cauterized, they were outfractured and  anterior inferior incision was created along the inferior turbinate  bilaterally and a small amount of turbinate bone was resected, preserving  the overlying nasal turbinate mucosa.     A Crowe-Davis mouth gag was inserted without difficulty and there were no  loose or broken teeth.  The hard and soft palate were intact.  The  tonsillectomy was begun by using Bovie electrocautery and dissecting in a  subcapsular fashion.  The entire left tonsil was resected from superior pole  to tongue base.  The right tonsil was removed in a similar fashion.  The  tonsillar fossa were gently abraded with dry tonsil sponges and several  areas of point hemorrhage were cauterized with suction cautery.  The Crowe-  Davis mouth gag was released and reapplied and there was no active bleeding.  With the tonsillectomy complete, the uvulopalatopharyngoplasty was  undertaken.   UP3 was begun by estimating the inferior aspect of the soft palatal tissue.  An approximate 1.5 cm strip of soft tissue including the anterior mucosa and  musculature was resected.  Dissection was carried out from anterior to posterior preserving the posterior palatal mucosa for closure.  The muscle   layer and entire uvula were resected and sent to pathology along with the  tonsil tissue for gross microscopic evaluation.  With the UP3 completed,  reconstruction was then undertaken using a 3-0 Vicryl suture on a tapered  needle in a horizontal mattress fashion.  The posterior tonsillar pillars  were advanced anteriorly and sutured to the anterior tonsillar pillars  closing the tonsillar fossa.  The margin of the palatal mucosa was then  advanced and the posterior mucosa was reapproximated anteriorly with  interrupted 3-0 Vicryl suture.  The patient's nasal cavity, nasopharynx,  oral cavity, and oropharynx was then irrigated and suctioned.  An orogastric  tube was passed and the stomach contents were aspirated.  The Crowe-Davis  mouth gag was released and reapplied with no bleeding.  The patient's CroweEarlene Plater mouth gag was removed.  There were no loose or broken teeth.  He was  then awakened from his  anesthetic, extubated, and transferred from the  operating room to the recovery in stable condition.  No complications.  Blood loss less than 50 mL.           ______________________________  Kinnie Scales. Annalee Genta, M.D.     DLS/MEDQ  D:  16/09/9603  T:  05/11/2006  Job:  [id number]

## 2011-05-18 ENCOUNTER — Other Ambulatory Visit: Payer: Self-pay | Admitting: Dermatology

## 2011-05-25 ENCOUNTER — Other Ambulatory Visit: Payer: Self-pay | Admitting: Surgery

## 2011-06-17 ENCOUNTER — Encounter: Payer: Self-pay | Admitting: Pulmonary Disease

## 2011-06-18 ENCOUNTER — Ambulatory Visit (INDEPENDENT_AMBULATORY_CARE_PROVIDER_SITE_OTHER): Payer: PRIVATE HEALTH INSURANCE | Admitting: Pulmonary Disease

## 2011-06-18 ENCOUNTER — Encounter: Payer: Self-pay | Admitting: Pulmonary Disease

## 2011-06-18 VITALS — BP 114/62 | HR 80 | Temp 98.0°F | Ht 70.0 in | Wt 227.2 lb

## 2011-06-18 DIAGNOSIS — G4733 Obstructive sleep apnea (adult) (pediatric): Secondary | ICD-10-CM

## 2011-06-18 NOTE — Progress Notes (Signed)
  Subjective:    Patient ID: Nicholas Moore, male    DOB: [birth year], 55 y.o.   MRN: [id number]  HPI The pt comes in today for f/u of his known osa.  He is wearing cpap compliantly, and feels he is sleeping fairly well with satisfactory daytime alertness.   He has lost 20 pounds since the last visit, and is trying to do more.  His only complaint is that of dry mouth after wearing cpap.  He has tried to make adjustments to his humidifier, without improvement.     Review of Systems  Constitutional: Negative for fever and unexpected weight change.  HENT: Positive for congestion. Negative for ear pain, nosebleeds, sore throat, rhinorrhea, sneezing, trouble swallowing, dental problem, postnasal drip and sinus pressure.   Eyes: Positive for itching. Negative for redness.  Respiratory: Negative for cough, chest tightness, shortness of breath and wheezing.   Cardiovascular: Negative for palpitations and leg swelling.  Gastrointestinal: Negative for nausea and vomiting.  Genitourinary: Negative for dysuria.  Musculoskeletal: Negative for joint swelling.  Skin: Negative for rash.  Neurological: Negative for headaches.  Hematological: Does not bruise/bleed easily.  Psychiatric/Behavioral: Negative for dysphoric mood. The patient is not nervous/anxious.        Objective:   Physical Exam Ow male in nad No skin breakdown or pressure necrosis from cpap mask No purulence or discharge from nares LE without edema, no cyanosis Alert and oriented, moves all 4        Assessment & Plan:

## 2011-06-18 NOTE — Patient Instructions (Signed)
Will recheck your pressure on auto mode for 2 weeks.  Will call you with the results.  Continue working on weight loss.  You are doing well. followup with me in one year, but call if you are able to get below 190 before the next visit.

## 2011-06-26 NOTE — Assessment & Plan Note (Signed)
The pt has been wearing cpap compliantly, and feels he is doing well with the device.  He has lost 20 pounds since his last visit.  His only complaint is that of mouth dryness, and this has persisted despite using a full face mask and adjusting his heated humidity.  I suspect this is either related to his own hydration, meds, or the humidity in his home.   Since he has lost significant weight, I think it is worthwhile to re-optimize his pressure.  It is possible a lower pressure may help with his humidity issues as well.

## 2011-09-05 ENCOUNTER — Other Ambulatory Visit: Payer: Self-pay | Admitting: Pulmonary Disease

## 2011-09-05 DIAGNOSIS — G4733 Obstructive sleep apnea (adult) (pediatric): Secondary | ICD-10-CM

## 2011-09-22 ENCOUNTER — Telehealth: Payer: Self-pay | Admitting: *Deleted

## 2011-09-22 NOTE — Telephone Encounter (Signed)
Opened in error

## 2011-09-24 ENCOUNTER — Other Ambulatory Visit: Payer: Self-pay | Admitting: Urology

## 2011-09-24 DIAGNOSIS — E291 Testicular hypofunction: Secondary | ICD-10-CM

## 2011-09-29 ENCOUNTER — Ambulatory Visit (HOSPITAL_COMMUNITY)
Admission: RE | Admit: 2011-09-29 | Discharge: 2011-09-29 | Disposition: A | Discharge status: Home / Self Care | Payer: PRIVATE HEALTH INSURANCE | Source: Ambulatory Visit | Attending: Urology | Admitting: Urology

## 2011-09-29 DIAGNOSIS — D353 Benign neoplasm of craniopharyngeal duct: Secondary | ICD-10-CM | POA: Insufficient documentation

## 2011-09-29 DIAGNOSIS — D352 Benign neoplasm of pituitary gland: Secondary | ICD-10-CM | POA: Insufficient documentation

## 2011-09-29 DIAGNOSIS — E291 Testicular hypofunction: Secondary | ICD-10-CM | POA: Insufficient documentation

## 2011-09-29 DIAGNOSIS — Z85820 Personal history of malignant melanoma of skin: Secondary | ICD-10-CM | POA: Insufficient documentation

## 2011-09-29 MED ORDER — GADOBENATE DIMEGLUMINE 529 MG/ML IV SOLN
15.0000 mL | Freq: Once | INTRAVENOUS | Status: AC | PRN
  Administered 2011-09-29: 15 mL via INTRAVENOUS

## 2012-05-04 ENCOUNTER — Other Ambulatory Visit: Payer: Self-pay | Admitting: Family Medicine

## 2012-05-05 NOTE — Telephone Encounter (Signed)
Call in one month of both with no more refills. He needs an OV

## 2012-05-12 ENCOUNTER — Encounter: Payer: Self-pay | Admitting: Family Medicine

## 2012-05-12 ENCOUNTER — Ambulatory Visit (INDEPENDENT_AMBULATORY_CARE_PROVIDER_SITE_OTHER): Payer: PRIVATE HEALTH INSURANCE | Admitting: Family Medicine

## 2012-05-12 VITALS — BP 114/78 | HR 75 | Temp 98.7°F | Wt 238.0 lb

## 2012-05-12 DIAGNOSIS — E785 Hyperlipidemia, unspecified: Secondary | ICD-10-CM

## 2012-05-12 DIAGNOSIS — F329 Major depressive disorder, single episode, unspecified: Secondary | ICD-10-CM

## 2012-05-12 MED ORDER — ATORVASTATIN CALCIUM 40 MG PO TABS: 40.0000 mg | ORAL_TABLET | Freq: Every day | ORAL | Status: DC

## 2012-05-12 MED ORDER — TESTOSTERONE 30 MG/ACT TD SOLN: 1.0000 "application " | Freq: Every day | TRANSDERMAL | Status: DC

## 2012-05-12 MED ORDER — CABERGOLINE 0.5 MG PO TABS: 0.2500 mg | ORAL_TABLET | ORAL | Status: AC

## 2012-05-12 MED ORDER — SERTRALINE HCL 100 MG PO TABS: 100.0000 mg | ORAL_TABLET | Freq: Every day | ORAL | Status: DC

## 2012-05-12 NOTE — Progress Notes (Signed)
  Subjective:    Patient ID: Nicholas Moore, male    DOB: 09/14/1956, 56 y.o.   MRN: [id number]  HPI Here for follow up. He has had an eventful year. 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 Evlyn Kanner for this. They are trying to shrink this with Cabergoline to avoid surgery. He is doing well so far. Otherwise he is doing well.    Review of Systems  Constitutional: Negative.   Respiratory: Negative.   Cardiovascular: Negative.   Neurological: Negative.        Objective:   Physical Exam  Constitutional: He appears well-developed and well-nourished.  Neck: No thyromegaly present.  Cardiovascular: Normal rate, regular rhythm, normal heart sounds and intact distal pulses.   Pulmonary/Chest: Effort normal and breath sounds normal.  Lymphadenopathy:    He has no cervical adenopathy.          Assessment & Plan:  Set up fasting labs

## 2012-05-15 ENCOUNTER — Other Ambulatory Visit: Payer: PRIVATE HEALTH INSURANCE

## 2012-05-18 ENCOUNTER — Other Ambulatory Visit: Payer: PRIVATE HEALTH INSURANCE

## 2012-05-18 LAB — LIPID PANEL
Cholesterol: 85 mg/dL (ref 0–200)
HDL: 27.8 mg/dL — ABNORMAL LOW (ref >39.00)
Triglycerides: 59 mg/dL (ref 0.0–149.0)

## 2012-05-18 LAB — BASIC METABOLIC PANEL
BUN: 15 mg/dL (ref 6–23)
CO2: 27 mEq/L (ref 19–32)
Calcium: 9.4 mg/dL (ref 8.4–10.5)
Creatinine, Ser: 1 mg/dL (ref 0.4–1.5)
Glucose, Bld: 85 mg/dL (ref 70–99)

## 2012-05-18 LAB — POCT URINALYSIS DIPSTICK
Glucose, UA: NEGATIVE
Nitrite, UA: NEGATIVE
Urobilinogen, UA: 0.2

## 2012-05-18 LAB — CBC WITH DIFFERENTIAL/PLATELET
Basophils Absolute: 0 10*3/uL (ref 0.0–0.1)
Basophils Relative: 0.3 % (ref 0.0–3.0)
Eosinophils Absolute: 0.2 10*3/uL (ref 0.0–0.7)
Lymphocytes Relative: 33.7 % (ref 12.0–46.0)
MCHC: 32.9 g/dL (ref 30.0–36.0)
Neutrophils Relative %: 52.4 % (ref 43.0–77.0)
RBC: 5.18 Mil/uL (ref 4.22–5.81)

## 2012-05-18 LAB — HEPATIC FUNCTION PANEL
Albumin: 3.7 g/dL (ref 3.5–5.2)
Alkaline Phosphatase: 45 U/L (ref 39–117)
Total Protein: 6.8 g/dL (ref 6.0–8.3)

## 2012-05-23 ENCOUNTER — Encounter: Payer: Self-pay | Admitting: Family Medicine

## 2012-05-23 NOTE — Progress Notes (Signed)
Quick Note:  I spoke with pt and put a copy of results in mail. ______ 

## 2012-08-07 ENCOUNTER — Other Ambulatory Visit (HOSPITAL_COMMUNITY): Payer: Self-pay | Admitting: Endocrinology

## 2012-08-07 DIAGNOSIS — D352 Benign neoplasm of pituitary gland: Secondary | ICD-10-CM

## 2012-08-09 ENCOUNTER — Inpatient Hospital Stay (HOSPITAL_COMMUNITY)
Admission: RE | Admit: 2012-08-09 | Discharge: 2012-08-09 | Payer: PRIVATE HEALTH INSURANCE | Source: Ambulatory Visit | Attending: Endocrinology | Admitting: Endocrinology

## 2013-03-30 ENCOUNTER — Other Ambulatory Visit (INDEPENDENT_AMBULATORY_CARE_PROVIDER_SITE_OTHER): Payer: PRIVATE HEALTH INSURANCE

## 2013-03-30 DIAGNOSIS — Z0000 Encounter for general adult medical examination without abnormal findings: Secondary | ICD-10-CM

## 2013-03-30 LAB — BASIC METABOLIC PANEL
BUN: 20 mg/dL (ref 6–23)
Chloride: 104 mEq/L (ref 96–112)
Potassium: 5 mEq/L (ref 3.5–5.1)
Sodium: 139 mEq/L (ref 135–145)

## 2013-03-30 LAB — POCT URINALYSIS DIPSTICK
Glucose, UA: NEGATIVE
Ketones, UA: NEGATIVE
Spec Grav, UA: 1.025
Urobilinogen, UA: 0.2

## 2013-03-30 LAB — HEPATIC FUNCTION PANEL: Albumin: 3.8 g/dL (ref 3.5–5.2)

## 2013-03-30 LAB — CBC WITH DIFFERENTIAL/PLATELET
Basophils Relative: 0.4 % (ref 0.0–3.0)
Eosinophils Relative: 2.6 % (ref 0.0–5.0)
HCT: 46.7 % (ref 39.0–52.0)
Hemoglobin: 15.6 g/dL (ref 13.0–17.0)
Lymphs Abs: 1.8 10*3/uL (ref 0.7–4.0)
MCV: 91.2 fl (ref 78.0–100.0)
Monocytes Absolute: 0.6 10*3/uL (ref 0.1–1.0)
RBC: 5.12 Mil/uL (ref 4.22–5.81)
WBC: 7.5 10*3/uL (ref 4.5–10.5)

## 2013-03-30 LAB — LIPID PANEL
Cholesterol: 113 mg/dL (ref 0–200)
LDL Cholesterol: 61 mg/dL (ref 0–99)
Triglycerides: 72 mg/dL (ref 0.0–149.0)

## 2013-03-30 NOTE — Progress Notes (Signed)
Quick Note:  Pt has appointment on 03/30/13 will go over then. ______ 

## 2013-04-04 ENCOUNTER — Encounter: Payer: Self-pay | Admitting: Family Medicine

## 2013-04-04 ENCOUNTER — Ambulatory Visit (INDEPENDENT_AMBULATORY_CARE_PROVIDER_SITE_OTHER): Payer: PRIVATE HEALTH INSURANCE | Admitting: Family Medicine

## 2013-04-04 VITALS — BP 120/72 | HR 70 | Temp 99.7°F | Ht 69.75 in | Wt 235.0 lb

## 2013-04-04 DIAGNOSIS — Z0000 Encounter for general adult medical examination without abnormal findings: Secondary | ICD-10-CM

## 2013-04-04 MED ORDER — SERTRALINE HCL 100 MG PO TABS: 100.0000 mg | ORAL_TABLET | Freq: Every day | ORAL | Status: DC

## 2013-04-04 MED ORDER — ATORVASTATIN CALCIUM 40 MG PO TABS: 40.0000 mg | ORAL_TABLET | Freq: Every day | ORAL | Status: DC

## 2013-04-04 NOTE — Progress Notes (Signed)
  Subjective:    Patient ID: Nicholas Moore, male    DOB: [birth year], 57 y.o.   MRN: [id number]  HPI 57 yr old male for a cpx. He feels well and has no concerns.    Review of Systems  Constitutional: Negative.   HENT: Negative.   Eyes: Negative.   Respiratory: Negative.   Cardiovascular: Negative.   Gastrointestinal: Negative.   Genitourinary: Negative.   Musculoskeletal: Negative.   Skin: Negative.   Neurological: Negative.   Psychiatric/Behavioral: Negative.        Objective:   Physical Exam  Constitutional: He is oriented to person, place, and time. He appears well-developed and well-nourished. No distress.  HENT:  Head: Normocephalic and atraumatic.  Right Ear: External ear normal.  Left Ear: External ear normal.  Nose: Nose normal.  Mouth/Throat: Oropharynx is clear and moist. No oropharyngeal exudate.  Eyes: Conjunctivae and EOM are normal. Pupils are equal, round, and reactive to light. Right eye exhibits no discharge. Left eye exhibits no discharge. No scleral icterus.  Neck: Neck supple. No JVD present. No tracheal deviation present. No thyromegaly present.  Cardiovascular: Normal rate, regular rhythm, normal heart sounds and intact distal pulses.  Exam reveals no gallop and no friction rub.   No murmur heard. EKG normal   Pulmonary/Chest: Effort normal and breath sounds normal. No respiratory distress. He has no wheezes. He has no rales. He exhibits no tenderness.  Abdominal: Soft. Bowel sounds are normal. He exhibits no distension and no mass. There is no tenderness. There is no rebound and no guarding.  Genitourinary: Rectum normal, prostate normal and penis normal. Guaiac negative stool. No penile tenderness.  Musculoskeletal: Normal range of motion. He exhibits no edema and no tenderness.  Lymphadenopathy:    He has no cervical adenopathy.  Neurological: He is alert and oriented to person, place, and time. He has normal reflexes. No cranial nerve deficit. He  exhibits normal muscle tone. Coordination normal.  Skin: Skin is warm and dry. No rash noted. He is not diaphoretic. No erythema. No pallor.  Psychiatric: He has a normal mood and affect. His behavior is normal. Judgment and thought content normal.          Assessment & Plan:  Well exam. Set up another colonoscopy.

## 2013-07-12 ENCOUNTER — Ambulatory Visit: Payer: Self-pay | Admitting: Family Medicine

## 2013-07-12 ENCOUNTER — Telehealth: Payer: Self-pay | Admitting: Family Medicine

## 2013-07-12 NOTE — Telephone Encounter (Signed)
Patient Information:  Caller Name: Pastor  Phone: [phone]  Patient: Nicholas Moore, Nicholas Moore  Gender: Male  DOB: [birth year]  Age: 57 Years  PCP: Gershon Crane [facility])  Office Follow Up:  Does the office need to follow up with this patient?: Yes  Instructions For The Office: appt needed today for facial swelling; provider specific scheduling issue krs/can  RN Note:  States has swelling noted to left side of face 07/11/13 PM.  States the inside of his cheek is painful to touch.  Afebrile.  Per face swelling protocol, advised appt today; no appts available with Dr. Clent Ridges in Epic.  Note to office per provider-specific preference for staff management of appt/callback.  May reach patient at [phone].  krs/can  Symptoms  Reason For Call & Symptoms: jaw symptoms; recently seen by dentist.  Swelling noted to inside of cheek  Reviewed Health History In EMR: Yes  Reviewed Medications In EMR: Yes  Reviewed Allergies In EMR: Yes  Reviewed Surgeries / Procedures: Yes  Date of Onset of Symptoms: 07/11/2013  Guideline(s) Used:  Face Swelling  Disposition Per Guideline:   See Today in Office  Reason For Disposition Reached:   Swelling is painful to touch  Advice Given:  N/A  Patient Will Follow Care Advice:  YES

## 2013-07-12 NOTE — Telephone Encounter (Signed)
Per Dr. Tawanna Cooler, pt should go to [facility].  Pt aware and states that swelling is due to having some dental work done recently. He will go to [facility]

## 2013-07-30 ENCOUNTER — Encounter: Payer: Self-pay | Admitting: Gastroenterology

## 2013-08-20 ENCOUNTER — Encounter: Payer: Self-pay | Admitting: Gastroenterology

## 2013-10-24 ENCOUNTER — Ambulatory Visit (AMBULATORY_SURGERY_CENTER): Payer: Self-pay

## 2013-10-24 VITALS — Ht 70.0 in | Wt 235.0 lb

## 2013-10-24 DIAGNOSIS — Z860100 Personal history of colon polyps, unspecified: Secondary | ICD-10-CM

## 2013-10-24 MED ORDER — MOVIPREP 100 G PO SOLR: 1.0000 | Freq: Once | ORAL | Status: DC

## 2013-11-06 ENCOUNTER — Ambulatory Visit (AMBULATORY_SURGERY_CENTER): Payer: PRIVATE HEALTH INSURANCE | Admitting: Gastroenterology

## 2013-11-06 ENCOUNTER — Encounter: Payer: Self-pay | Admitting: Gastroenterology

## 2013-11-06 VITALS — BP 116/77 | HR 61 | Temp 97.9°F | Resp 17 | Ht 70.0 in | Wt 235.0 lb

## 2013-11-06 DIAGNOSIS — Z860100 Personal history of colon polyps, unspecified: Secondary | ICD-10-CM

## 2013-11-06 DIAGNOSIS — D126 Benign neoplasm of colon, unspecified: Secondary | ICD-10-CM

## 2013-11-06 HISTORY — PX: COLONOSCOPY: SHX174

## 2013-11-06 MED ORDER — SODIUM CHLORIDE 0.9 % IV SOLN: 500.0000 mL | INTRAVENOUS | Status: DC

## 2013-11-06 NOTE — Patient Instructions (Signed)

## 2013-11-06 NOTE — Progress Notes (Signed)
Called to room to assist during endoscopic procedure.  Patient ID and intended procedure confirmed with present staff. Received instructions for my participation in the procedure from the performing physician.  

## 2013-11-06 NOTE — Op Note (Signed)
[facility] [address] [place], [address]   COLONOSCOPY PROCEDURE REPORT  PATIENT: Nicholas Moore, Nicholas Moore  MR#: [id number] BIRTHDATE: 1956-02-01 , 56  yrs. old GENDER: Male ENDOSCOPIST: Meryl Dare, MD, [facility] PROCEDURE DATE:  11/06/2013 PROCEDURE:   Colonoscopy with snare polypectomy First Screening Colonoscopy - Avg.  risk and is 50 yrs.  old or older - No.  Prior Negative Screening - Now for repeat screening. N/A  History of Adenoma - Now for follow-up colonoscopy & has been > or = to 3 yrs.  Yes hx of adenoma.  Has been 3 or more years since last colonoscopy.  Polyps Removed Today? Yes. ASA CLASS:   Class II INDICATIONS:Patient's personal history of adenomatous colon polyps.  MEDICATIONS: MAC sedation, administered by CRNA and propofol (Diprivan) 200mg  IV DESCRIPTION OF PROCEDURE:   After the risks benefits and alternatives of the procedure were thoroughly explained, informed consent was obtained.  A digital rectal exam revealed no abnormalities of the rectum.   The [id number]  endoscope was introduced through the anus and advanced to the cecum, which was identified by both the appendix and ileocecal valve. No adverse events experienced.   The quality of the prep was good, using MoviPrep  The instrument was then slowly withdrawn as the colon was fully examined.  COLON FINDINGS: A sessile polyp measuring 8 mm in size was found at the hepatic flexure. A polypectomy was performed using snare cautery.  The resection was complete and the polyp tissue was completely retrieved.  The colon was otherwise normal.  There was no diverticulosis, inflammation, polyps or cancers unless previously stated.  Retroflexed views revealed small internal hemorrhoids. The time to cecum=2 minutes 42 seconds. Withdrawal time=9 minutes 12 seconds. The scope was withdrawn and the procedure completed. COMPLICATIONS: There were no complications.  ENDOSCOPIC IMPRESSION: 1.    Sessile polyp measuring 8 mm at the hepatic flexure; polypectomy performed using snare cautery 2.   Small internal hemorrhoids  RECOMMENDATIONS: 1.  Await pathology results 2.  Hold aspirin, aspirin products, and anti-inflammatory medication for 2 weeks. 3.  Repeat Colonoscopy in 5 years.  eSigned:  Meryl Dare, MD, [facility] 11/06/2013 11:30 AM

## 2013-11-06 NOTE — Progress Notes (Signed)
Lidocaine-40mg IV prior to Propofol InductionPropofol given over incremental dosages 

## 2013-11-06 NOTE — Progress Notes (Signed)
Patient did not experience any of the following events: a burn prior to discharge; a fall within the facility; wrong site/side/patient/procedure/implant event; or a hospital transfer or hospital admission upon discharge from the facility. (G8907) Patient did not have preoperative order for IV antibiotic SSI prophylaxis. (G8918)  

## 2013-11-07 ENCOUNTER — Telehealth: Payer: Self-pay | Admitting: *Deleted

## 2013-11-07 NOTE — Telephone Encounter (Signed)
Message left

## 2013-11-12 ENCOUNTER — Encounter: Payer: Self-pay | Admitting: Gastroenterology

## 2014-04-01 ENCOUNTER — Telehealth: Payer: Self-pay | Admitting: Family Medicine

## 2014-04-01 NOTE — Telephone Encounter (Signed)
Pt states this is the wrong doctor for his rx. Cancel request.

## 2014-11-11 ENCOUNTER — Ambulatory Visit (INDEPENDENT_AMBULATORY_CARE_PROVIDER_SITE_OTHER): Payer: 59 | Admitting: Internal Medicine

## 2014-11-11 ENCOUNTER — Encounter: Payer: Self-pay | Admitting: Internal Medicine

## 2014-11-11 VITALS — BP 120/74 | Temp 98.4°F | Ht 69.5 in | Wt 240.0 lb

## 2014-11-11 DIAGNOSIS — M79604 Pain in right leg: Secondary | ICD-10-CM

## 2014-11-11 DIAGNOSIS — M79601 Pain in right arm: Secondary | ICD-10-CM

## 2014-11-11 NOTE — Progress Notes (Signed)
Pre visit review using our clinic review tool, if applicable. No additional management support is needed unless otherwise documented below in the visit note.   Chief Complaint  Patient presents with  . Arm Pain    Had labs drawn 10 days ago.  Now has pain in her lower arm when he moves it.    HPI: Nicholas Moore 58 y.o. comesin today for acute evaluation.   PCP NA About 10 days ago he had a venipuncture in his right antecubital fossa for insurance exam. There was a little bit of difficulty getting the blood work and some prodding and some bruising around the site but nothing else at the time. Soon after he is been working with lifting and activity chainsaw in yard work. He has had soreness and tenderness at the antecubital fossa area and sometimes with rotation of his elbow since that time and wants to make sure there is nothing to worry about.  No redness streaking fever numbness or weaknesshe is right-handed. ROS: See pertinent positives and negatives per HPI.  Past Medical History  Diagnosis Date  . Allergy   . GERD (gastroesophageal reflux disease)   . Hyperlipidemia   . Sleep apnea     sees Dr. Danton Sewer, wears CPAP  . History of hypotestosteronemia     sees Dr. Preston Fleeting  . ED (erectile dysfunction)   . Hypertension   . Melanoma     dr Allyson Sabal   . Anxiety   . Pituitary adenoma with extrasellar extension     sees Dr. Roque Cash   . History of colonic polyps     Family History  Problem Relation Age of Onset  . Alcohol abuse    . Coronary artery disease    . Hypertension    . Emphysema    . Colon cancer Neg Hx   . Pancreatic cancer Neg Hx   . Stomach cancer Neg Hx     History   Social History  . Marital Status: Married    Spouse Name: N/A    Number of Children: N/A  . Years of Education: N/A   Social History Main Topics  . Smoking status: Never Smoker   . Smokeless tobacco: Never Used  . Alcohol Use: 1.5 oz/week    3 drink(s) per week  . Drug  Use: No  . Sexual Activity: None   Other Topics Concern  . None   Social History Narrative    Outpatient Encounter Prescriptions as of 11/11/2014  Medication Sig  . atorvastatin (LIPITOR) 40 MG tablet Take 1 tablet (40 mg total) by mouth daily.  . cabergoline (DOSTINEX) 0.5 MG tablet Take 0.5 mg by mouth 2 (two) times a week.  . Multiple Vitamin (MULTIVITAMIN) tablet Take 1 tablet by mouth daily.    . sertraline (ZOLOFT) 100 MG tablet Take 1 tablet (100 mg total) by mouth daily.  . Testosterone (AXIRON) 30 MG/ACT SOLN Place 1 application onto the skin daily.    EXAM:  BP 120/74 mmHg  Temp(Src) 98.4 F (36.9 C) (Oral)  Ht 5' 9.5" (1.765 m)  Wt 240 lb (108.863 kg)  BMI 34.95 kg/m2  Body mass index is 34.95 kg/(m^2).  GENERAL: vitals reviewed and listed above, alert, oriented, appears well hydrated and in no acute distress HEENT: atraumatic, conjunctiva  clear, no obvious abnormalities on inspection of external nose and ears  NECK: no obvious masses on inspection  MS: moves all extremities without noticeable focal  Abnormality  right antecubital fossa  with healed punctate area medially. On palpation there is tenderness around the biceps tendon some soft tissue thickening but good range of motion no crepitus normal strength pain is aggravated by resisted flexion. Vascular intact veins collapsed nicely  raised above heart level no redness streaking or swelling.no cords PSYCH: pleasant and cooperative, no obvious depression or anxiety  ASSESSMENT AND PLAN:  Discussed the following assessment and plan:  Arm pain, anterior, right - antecubital fossa after in a puncture. Possibly traumatic irritated tendinitis biceps with venipuncture minor injury and  vigorous use.  Conservative treatment  Observation follow-up if persistent progressive. Don't see any signs of infectious or thrombosis at this time -Patient advised to return or notify health care team  if symptoms worsen ,persist or  new concerns arise.  Patient Instructions  This pain is probably from a combination of the blood   Draw  and tendinitis .   Possible  NO evidence of nerve damage  Clotting or infection. Observe for the next 2-4  Weeks .    Expect improvement at that time.    F/U with Dr.  Sarajane Jews if hot  Red ,dysfunction. Etc.      Standley Brooking. Panosh M.D.

## 2014-11-11 NOTE — Patient Instructions (Signed)
This pain is probably from a combination of the blood   Draw  and tendinitis .   Possible  NO evidence of nerve damage  Clotting or infection. Observe for the next 2-4  Weeks .    Expect improvement at that time.    F/U with Dr.  Sarajane Jews if hot  Red ,dysfunction. Etc.

## 2015-01-15 ENCOUNTER — Other Ambulatory Visit (INDEPENDENT_AMBULATORY_CARE_PROVIDER_SITE_OTHER): Payer: 59

## 2015-01-15 DIAGNOSIS — Z0000 Encounter for general adult medical examination without abnormal findings: Secondary | ICD-10-CM

## 2015-01-15 LAB — POCT URINALYSIS DIPSTICK
BILIRUBIN UA: NEGATIVE
Glucose, UA: NEGATIVE
Ketones, UA: NEGATIVE
LEUKOCYTES UA: NEGATIVE
Nitrite, UA: NEGATIVE
PH UA: 7
PROTEIN UA: NEGATIVE
RBC UA: NEGATIVE
Spec Grav, UA: 1.015
Urobilinogen, UA: 0.2

## 2015-01-15 LAB — CBC WITH DIFFERENTIAL/PLATELET
Basophils Absolute: 0 10*3/uL (ref 0.0–0.1)
Basophils Relative: 0.7 % (ref 0.0–3.0)
EOS ABS: 0.1 10*3/uL (ref 0.0–0.7)
Eosinophils Relative: 1.9 % (ref 0.0–5.0)
HCT: 51 % (ref 39.0–52.0)
Hemoglobin: 17.4 g/dL — ABNORMAL HIGH (ref 13.0–17.0)
Lymphocytes Relative: 28 % (ref 12.0–46.0)
Lymphs Abs: 1.7 10*3/uL (ref 0.7–4.0)
MCHC: 34.1 g/dL (ref 30.0–36.0)
MCV: 89.6 fl (ref 78.0–100.0)
Monocytes Absolute: 0.4 10*3/uL (ref 0.1–1.0)
Monocytes Relative: 7.4 % (ref 3.0–12.0)
NEUTROS PCT: 62 % (ref 43.0–77.0)
Neutro Abs: 3.8 10*3/uL (ref 1.4–7.7)
PLATELETS: 246 10*3/uL (ref 150.0–400.0)
RBC: 5.69 Mil/uL (ref 4.22–5.81)
RDW: 13.5 % (ref 11.5–15.5)
WBC: 6.1 10*3/uL (ref 4.0–10.5)

## 2015-01-15 LAB — COMPREHENSIVE METABOLIC PANEL
ALT: 24 U/L (ref 0–53)
AST: 25 U/L (ref 0–37)
Albumin: 4.3 g/dL (ref 3.5–5.2)
Alkaline Phosphatase: 64 U/L (ref 39–117)
BUN: 20 mg/dL (ref 6–23)
CALCIUM: 9.7 mg/dL (ref 8.4–10.5)
CHLORIDE: 103 meq/L (ref 96–112)
CO2: 28 mEq/L (ref 19–32)
CREATININE: 1.03 mg/dL (ref 0.40–1.50)
GFR: 78.8 mL/min (ref >60.00)
GLUCOSE: 98 mg/dL (ref 70–99)
POTASSIUM: 4.9 meq/L (ref 3.5–5.1)
Sodium: 138 mEq/L (ref 135–145)
Total Bilirubin: 0.8 mg/dL (ref 0.2–1.2)
Total Protein: 6.9 g/dL (ref 6.0–8.3)

## 2015-01-15 LAB — TSH: TSH: 1.67 u[IU]/mL (ref 0.35–4.50)

## 2015-01-15 LAB — LIPID PANEL
Cholesterol: 122 mg/dL (ref 0–200)
HDL: 39.6 mg/dL (ref >39.00)
LDL Cholesterol: 69 mg/dL (ref 0–99)
NONHDL: 82.4
TRIGLYCERIDES: 67 mg/dL (ref 0.0–149.0)
Total CHOL/HDL Ratio: 3
VLDL: 13.4 mg/dL (ref 0.0–40.0)

## 2015-01-15 LAB — PSA: PSA: 0.26 ng/mL (ref 0.10–4.00)

## 2015-01-22 ENCOUNTER — Ambulatory Visit (INDEPENDENT_AMBULATORY_CARE_PROVIDER_SITE_OTHER): Payer: 59 | Admitting: Family Medicine

## 2015-01-22 ENCOUNTER — Encounter: Payer: Self-pay | Admitting: Family Medicine

## 2015-01-22 VITALS — BP 116/65 | HR 59 | Temp 99.1°F | Ht 69.5 in | Wt 238.0 lb

## 2015-01-22 DIAGNOSIS — Z0000 Encounter for general adult medical examination without abnormal findings: Secondary | ICD-10-CM

## 2015-01-22 MED ORDER — SERTRALINE HCL 100 MG PO TABS
100.0000 mg | ORAL_TABLET | Freq: Every day | ORAL | Status: DC
Start: 2015-01-22 — End: 2016-02-24

## 2015-01-22 MED ORDER — ATORVASTATIN CALCIUM 40 MG PO TABS: 40.0000 mg | ORAL_TABLET | Freq: Every day | ORAL | Status: DC

## 2015-01-22 NOTE — Progress Notes (Signed)
   Subjective:    Patient ID: Nicholas Moore, male    DOB: 03-Nov-1956, 59 y.o.   MRN: [id number]  HPI 59 yr old male for a cpx. He feels well. He sees Dr. Diona Fanti at least yearly and he follows up with Dr. Forde Dandy.    Review of Systems  Constitutional: Negative.   HENT: Negative.   Eyes: Negative.   Respiratory: Negative.   Cardiovascular: Negative.   Gastrointestinal: Negative.   Genitourinary: Negative.   Musculoskeletal: Negative.   Skin: Negative.   Neurological: Negative.   Psychiatric/Behavioral: Negative.        Objective:   Physical Exam  Constitutional: He is oriented to person, place, and time. He appears well-developed and well-nourished. No distress.  HENT:  Head: Normocephalic and atraumatic.  Right Ear: External ear normal.  Left Ear: External ear normal.  Nose: Nose normal.  Mouth/Throat: Oropharynx is clear and moist. No oropharyngeal exudate.  Eyes: Conjunctivae and EOM are normal. Pupils are equal, round, and reactive to light. Right eye exhibits no discharge. Left eye exhibits no discharge. No scleral icterus.  Neck: Neck supple. No JVD present. No tracheal deviation present. No thyromegaly present.  Cardiovascular: Normal rate, regular rhythm, normal heart sounds and intact distal pulses.  Exam reveals no gallop and no friction rub.   No murmur heard. EKG normal   Pulmonary/Chest: Effort normal and breath sounds normal. No respiratory distress. He has no wheezes. He has no rales. He exhibits no tenderness.  Abdominal: Soft. Bowel sounds are normal. He exhibits no distension and no mass. There is no tenderness. There is no rebound and no guarding.  Genitourinary: Rectum normal, prostate normal and penis normal. Guaiac negative stool. No penile tenderness.  Musculoskeletal: Normal range of motion. He exhibits no edema or tenderness.  Lymphadenopathy:    He has no cervical adenopathy.  Neurological: He is alert and oriented to person, place, and time. He has  normal reflexes. No cranial nerve deficit. He exhibits normal muscle tone. Coordination normal.  Skin: Skin is warm and dry. No rash noted. He is not diaphoretic. No erythema. No pallor.  Psychiatric: He has a normal mood and affect. His behavior is normal. Judgment and thought content normal.          Assessment & Plan:  Well exam.

## 2015-01-22 NOTE — Progress Notes (Signed)
Pre visit review using our clinic review tool, if applicable. No additional management support is needed unless otherwise documented below in the visit note. 

## 2015-12-02 ENCOUNTER — Encounter: Payer: Self-pay | Admitting: Family Medicine

## 2015-12-02 ENCOUNTER — Ambulatory Visit (INDEPENDENT_AMBULATORY_CARE_PROVIDER_SITE_OTHER): Payer: 59 | Admitting: Family Medicine

## 2015-12-02 VITALS — BP 111/65 | HR 58 | Temp 99.0°F | Ht 69.5 in | Wt 243.0 lb

## 2015-12-02 DIAGNOSIS — M545 Low back pain, unspecified: Secondary | ICD-10-CM

## 2015-12-02 DIAGNOSIS — M79605 Pain in left leg: Principal | ICD-10-CM

## 2015-12-02 NOTE — Progress Notes (Signed)
Pre visit review using our clinic review tool, if applicable. No additional management support is needed unless otherwise documented below in the visit note. 

## 2015-12-02 NOTE — Progress Notes (Signed)
   Subjective:    Patient ID: Nicholas Moore, male    DOB: November 17, 1956, 59 y.o.   MRN: [id number]  HPI Here to discuss 3 months of low back pain. No hx of trauma. He describes an intermittent sharp pain in the left lower back that sometimes radiates down the left leg to the foot. No weakness but he does feel slight numbness in both thighs at times. He has seen [employer] and they took XRays that revealed some mild arthritis of the spine. He has seen a chiropractor for treatments twice a month, and this helps a little. He has taken no medication for this. He has also been seeing a PT who works at a [employer], and they are working on exercises to strengthen his core muscles.    Review of Systems  Constitutional: Negative.   Musculoskeletal: Positive for back pain.       Objective:   Physical Exam  Constitutional: He appears well-developed and well-nourished. No distress.  Musculoskeletal:  The lower back has some muscle spasm present but it is not tender. ROM is full and SLR are negative          Assessment & Plan:  Low back pain, most likely from degenerative discs. I advised him to continue with the core exercises and losing weight would help. He can take Advil prn. Recheck prn

## 2016-01-01 ENCOUNTER — Encounter: Payer: Self-pay | Admitting: Family Medicine

## 2016-01-01 ENCOUNTER — Ambulatory Visit (INDEPENDENT_AMBULATORY_CARE_PROVIDER_SITE_OTHER): Payer: 59 | Admitting: Family Medicine

## 2016-01-01 VITALS — BP 122/86 | HR 61 | Temp 97.6°F | Ht 69.5 in | Wt 246.0 lb

## 2016-01-01 DIAGNOSIS — H9202 Otalgia, left ear: Secondary | ICD-10-CM | POA: Diagnosis not present

## 2016-01-01 NOTE — Progress Notes (Signed)
Pre visit review using our clinic review tool, if applicable. No additional management support is needed unless otherwise documented below in the visit note. 

## 2016-01-01 NOTE — Progress Notes (Signed)
HPI:  Nicholas Moore Moore is a very pleasant 60 yo here for an acute visit for:  L ear pain -started: 4-5 days ago -symptoms: occ popping and pain in L ear -also has some lower molar issues he is seeing dentist for and is possibly going to require a root canal -denies:fever, SOB, NVD, drainage from ear -has tried hydrogen peroxide in ear canal -sick contacts/travel/risks: denies flu exposure, tick exposure or or Ebola risks -Hx of: admits to hx of allergies and ear infections  ROS: See pertinent positives and negatives per HPI.  Past Medical History  Diagnosis Date  . Allergy   . GERD (gastroesophageal reflux disease)   . Hyperlipidemia   . Sleep apnea     sees Dr. Danton Sewer, wears CPAP  . History of hypotestosteronemia     sees Dr. Preston Fleeting  . ED (erectile dysfunction)   . Hypertension   . Melanoma ([place])     dr Allyson Sabal   . Anxiety   . Pituitary adenoma with extrasellar extension [facility])     sees Dr. Roque Cash   . History of colonic polyps     Past Surgical History  Procedure Laterality Date  . Knee arthroscopy    . Tonsillectomy  2007  . Septoplasty  2007  . Colonoscopy  11-06-13    per Dr. Fuller Plan, adenomatous polyps, repeat in 5 yrs   . Skin lesion excision      sees Dr. Nena Polio     Family History  Problem Relation Age of Onset  . Alcohol abuse    . Coronary artery disease    . Hypertension    . Emphysema    . Colon cancer Neg Hx   . Pancreatic cancer Neg Hx   . Stomach cancer Neg Hx     Social History   Social History  . Marital Status: Married    Spouse Name: N/A  . Number of Children: N/A  . Years of Education: N/A   Social History Main Topics  . Smoking status: Never Smoker   . Smokeless tobacco: Never Used  . Alcohol Use: 0.0 oz/week    0 Standard drinks or equivalent per week     Comment: occ  . Drug Use: No  . Sexual Activity: Not Asked   Other Topics Concern  . None   Social History Narrative     Current outpatient  prescriptions:  .  atorvastatin (LIPITOR) 40 MG tablet, Take 1 tablet (40 mg total) by mouth daily., Disp: 90 tablet, Rfl: 3 .  cabergoline (DOSTINEX) 0.5 MG tablet, Take 0.5 mg by mouth 2 (two) times a week., Disp: , Rfl:  .  Multiple Vitamin (MULTIVITAMIN) tablet, Take 1 tablet by mouth daily.  , Disp: , Rfl:  .  Omega-3 Fatty Acids (FISH OIL PO), Take by mouth daily., Disp: , Rfl:  .  sertraline (ZOLOFT) 100 MG tablet, Take 1 tablet (100 mg total) by mouth daily., Disp: 90 tablet, Rfl: 3 .  Testosterone (AXIRON) 30 MG/ACT SOLN, Place 1 application onto the skin daily., Disp: 90 mL, Rfl: 0  EXAM:  Filed Vitals:   01/01/16 1611  BP: 122/86  Pulse: 61  Temp: 97.6 F (36.4 C)    Body mass index is 35.82 kg/(m^2).  GENERAL: vitals reviewed and listed above, alert, oriented, appears well hydrated and in no acute distress  HEENT: atraumatic, conjunttiva clear, no obvious abnormalities on inspection of external nose and ears, normal appearance of ear canals and TMs,  clear nasal congestion, mild post oropharyngeal erythema with PND, no tonsillar edema or exudate, no sinus TTP  NECK: no obvious masses on inspection  LUNGS: clear to auscultation bilaterally, no wheezes, rales or rhonchi, good air movement  CV: HRRR, no peripheral edema  MS: moves all extremities without noticeable abnormality  PSYCH: pleasant and cooperative, no obvious depression or anxiety  ASSESSMENT AND PLAN:  Discussed the following assessment and plan:  Ear pain, left  -no ear infection or effusion, ears look good - may be eustachian tube dysfunction and opted to treat for this and have him follow up with his dentist about his tooth. Follow up here if ear issues persist or worsen.   Patient Instructions  AFRIN nasal spray for 3 days then STOP - do not use longer  Then flonase nasal spray 2 sprays each nostril for 21 days  Follow up as needed  Follow up with your dentist about your tooth if persistent  issues     Nicholas Moore Moore R.

## 2016-01-01 NOTE — Patient Instructions (Signed)
AFRIN nasal spray for 3 days then STOP - do not use longer  Then flonase nasal spray 2 sprays each nostril for 21 days  Follow up as needed  Follow up with your dentist about your tooth if persistent issues

## 2016-02-24 ENCOUNTER — Other Ambulatory Visit: Payer: Self-pay | Admitting: Family Medicine

## 2016-03-11 ENCOUNTER — Telehealth: Payer: Self-pay | Admitting: Family Medicine

## 2016-03-11 NOTE — Telephone Encounter (Signed)
Pt needs new rx for cpap machine fax to  [phone]- [facility] phone [phone]

## 2016-03-12 NOTE — Telephone Encounter (Signed)
Dr. Sarajane Jews does not handle any ordering for CPAP, this must come from pulmonary doctor.

## 2016-03-12 NOTE — Telephone Encounter (Signed)
lmom for pt to call back

## 2016-03-15 NOTE — Telephone Encounter (Signed)
Pt will call dr clance office

## 2016-04-07 ENCOUNTER — Institutional Professional Consult (permissible substitution): Payer: 59 | Admitting: Pulmonary Disease

## 2016-06-07 ENCOUNTER — Encounter: Payer: Self-pay | Admitting: Pulmonary Disease

## 2016-06-07 ENCOUNTER — Ambulatory Visit (INDEPENDENT_AMBULATORY_CARE_PROVIDER_SITE_OTHER): Payer: 59 | Admitting: Pulmonary Disease

## 2016-06-07 VITALS — BP 122/74 | HR 60 | Ht 71.0 in | Wt 238.6 lb

## 2016-06-07 DIAGNOSIS — J309 Allergic rhinitis, unspecified: Secondary | ICD-10-CM | POA: Diagnosis not present

## 2016-06-07 DIAGNOSIS — G4733 Obstructive sleep apnea (adult) (pediatric): Secondary | ICD-10-CM

## 2016-06-07 DIAGNOSIS — E669 Obesity, unspecified: Secondary | ICD-10-CM | POA: Diagnosis not present

## 2016-06-07 NOTE — Progress Notes (Signed)
Subjective:    Patient ID: Nicholas Moore, male    DOB: 10-Feb-1956, 60 y.o.   MRN: [id number]  HPI   This is the case of Nicholas Moore, 60 y.o. Male, who was referred by Dr. Alysia Penna in consultation regarding OSA.   As you very well know, patient was dxed with osa 10 yrs ago.  Had a PSG in [place], unsure severity. Had a rpt sleep study 5 yrs ago.  On autocpap 12-16.  Has hypersomnia recently.  Occasional snoring. (-) witnessed apneas, gasping, choking.  Machine sometimes does not deliver enough pressure. Machine is old and not reliable.  Not on o2.  Has a nasal mask.   Non smoker. No asthma or copd.      Review of Systems  Constitutional: Negative for fever and unexpected weight change.  HENT: Positive for congestion. Negative for dental problem, ear pain, nosebleeds, postnasal drip, rhinorrhea, sinus pressure, sneezing, sore throat and trouble swallowing.   Eyes: Negative for redness and itching.  Respiratory: Negative for cough, chest tightness, shortness of breath and wheezing.   Cardiovascular: Negative for palpitations and leg swelling.  Gastrointestinal: Negative for nausea and vomiting.  Genitourinary: Negative for dysuria.  Musculoskeletal: Negative for joint swelling.  Skin: Negative for rash.  Neurological: Negative for headaches.  Hematological: Does not bruise/bleed easily.  Psychiatric/Behavioral: Negative for dysphoric mood. The patient is not nervous/anxious.      Past Medical History  Diagnosis Date  . Allergy   . GERD (gastroesophageal reflux disease)   . Hyperlipidemia   . Sleep apnea     sees Dr. Danton Sewer, wears CPAP  . History of hypotestosteronemia     sees Dr. Preston Fleeting  . ED (erectile dysfunction)   . Hypertension   . Melanoma ([place])     dr Allyson Sabal   . Anxiety   . Pituitary adenoma with extrasellar extension [facility])     sees Dr. Roque Cash   . History of colonic polyps    (-) DVT.    Family History  Problem Relation  Age of Onset  . Alcohol abuse    . Coronary artery disease    . Hypertension    . Emphysema    . Colon cancer Neg Hx   . Pancreatic cancer Neg Hx   . Stomach cancer Neg Hx      Past Surgical History  Procedure Laterality Date  . Knee arthroscopy    . Tonsillectomy  2007  . Septoplasty  2007  . Colonoscopy  11-06-13    per Dr. Fuller Plan, adenomatous polyps, repeat in 5 yrs   . Skin lesion excision      sees Dr. Nena Polio     Social History   Social History  . Marital Status: Married    Spouse Name: N/A  . Number of Children: N/A  . Years of Education: N/A   Occupational History  . Not on file.   Social History Main Topics  . Smoking status: Never Smoker   . Smokeless tobacco: Never Used  . Alcohol Use: 0.0 oz/week    0 Standard drinks or equivalent per week     Comment: occ  . Drug Use: No  . Sexual Activity: Not on file   Other Topics Concern  . 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 (-) smoke. Occasional ETOH.   No Known Allergies   Outpatient Prescriptions Prior to Visit  Medication Sig  Dispense Refill  . atorvastatin (LIPITOR) 40 MG tablet TAKE ONE TABLET BY MOUTH ONE TIME DAILY 90 tablet 3  . cabergoline (DOSTINEX) 0.5 MG tablet Take 0.5 mg by mouth 2 (two) times a week.    . Multiple Vitamin (MULTIVITAMIN) tablet Take 1 tablet by mouth daily.      . Omega-3 Fatty Acids (FISH OIL PO) Take by mouth daily.    . sertraline (ZOLOFT) 100 MG tablet TAKE ONE TABLET BY MOUTH ONE TIME DAILY 90 tablet 3  . Testosterone (AXIRON) 30 MG/ACT SOLN Place 1 application onto the skin daily. 90 mL 0   No facility-administered medications prior to visit.   Meds ordered this encounter  Medications  . fluticasone (FLONASE ALLERGY RELIEF) 50 MCG/ACT nasal spray    Sig: Place 2 sprays into the nose daily.  Objective:   Physical Exam  Vitals:  Filed Vitals:   06/07/16 1603  BP: 122/74  Pulse: 60  Height: 5\' 11"  (1.803 m)  Weight: 238 lb 9.6 oz (108.228 kg)  SpO2: 96%    Constitutional/General:  Pleasant, well-nourished, well-developed, not in any distress,  Comfortably seating.  Well kempt  Body mass index is 33.29 kg/(m^2). Wt Readings from Last 3 Encounters:  06/07/16 238 lb 9.6 oz (108.228 kg)  01/01/16 246 lb (111.585 kg)  12/02/15 243 lb (110.224 kg)    Neck circumference: 17.5 inches HEENT: Pupils equal and reactive to light and accommodation. Anicteric sclerae. Normal nasal mucosa.   No oral  lesions,  mouth clear,  oropharynx clear, no postnasal drip. (-) Oral thrush. No dental caries.  Airway - Mallampati class III  Neck: No masses. Midline trachea. No JVD, (-) LAD. (-) bruits appreciated.  Respiratory/Chest: Grossly normal chest. (-) deformity. (-) Accessory muscle use.  Symmetric expansion. (-) Tenderness on palpation.  Resonant on percussion.  Diminished BS on both lower lung zones. (-) wheezing, crackles, rhonchi (-) egophony  Cardiovascular: Regular rate and  rhythm, heart sounds normal, no murmur or gallops, no peripheral edema  Gastrointestinal:  Normal bowel sounds. Soft,  non-tender. No hepatosplenomegaly.  (-) masses.   Musculoskeletal:  Normal muscle tone. Normal gait.   Extremities: Grossly normal. (-) clubbing, cyanosis.  (-) edema  Skin: (-) rash,lesions seen.   Neurological/Psychiatric : alert, oriented to time, place, person. Normal mood and affect           Assessment & Plan:  Obstructive sleep apnea patient was dxed with osa 10 yrs ago. AHI 15.  Had a PSG in [place], unsure severity. Had a rpt sleep study 5 yrs ago. Optimal pressure was 14 cm water.  On autocpap 12-16.  Has hypersomnia recently.  Occasional snoring. (-) witnessed apneas, gasping, choking.  Machine sometimes does not deliver enough pressure. Machine is old and not reliable.  Not on o2.  Has a nasal mask.   Plan: We extensively discussed the importance of treating OSA and the need to use PAP therapy.   Continue with current cpap machine. Current machine is old and sometimes does not deliver enough pressure. Recent hypersomnia as well. Plan to get a new autocpap machine.  May need to have a HST.     Patient was instructed to have mask, tubings, filter, reservoir cleaned at least once a week with soapy water.  Patient was instructed to call the office if he/she is having issues with the PAP device.    I advised patient to obtain sufficient amount of sleep --  7 to 8 hours at least in a 24 hr period.  Patient was advised to follow good sleep hygiene.  Patient was advised NOT to engage in activities requiring concentration and/or vigilance if he/she is and  sleepy.  Patient is NOT to drive if he/she is sleepy.      Allergic rhinitis Stable. flonase prn.   OBESITY Weight reduction.      Thank you very much for letting me participate in this patient's care. Please do not hesitate to give me a call if you have any questions or concerns regarding the treatment plan.   Patient will follow up with me in 3 months.     Monica Becton, MD 06/08/2016    2:41 AM Pulmonary and Thermal Pager: [phone] Office: [phone], Fax: [phone]

## 2016-06-07 NOTE — Patient Instructions (Signed)
   It was a pleasure taking care of you today!  You have OSA. You stop breathing 15 times/hr.   We will order you a CPAP  machine.  Please call the office if you do NOT receive your machine in the next 1-2 weeks.   Please make sure you use your CPAP device everytime you sleep.  We will monitor the usage of your machine per your insurance requirement.  Your insurance company may take the machine from you if you are not using it regularly.   Please clean the mask, tubings, filter, water reservoir with soapy water every week.  Please use distilled water for the water reservoir.   Please call the office or your machine provider (DME company) if you are having issues with the device.   Return to clinic in 2-3 months with Dr. Corrie Dandy or NP.

## 2016-06-08 NOTE — Assessment & Plan Note (Signed)
patient was dxed with osa 10 yrs ago. AHI 15.  Had a PSG in [place], unsure severity. Had a rpt sleep study 5 yrs ago. Optimal pressure was 14 cm water.  On autocpap 12-16.  Has hypersomnia recently.  Occasional snoring. (-) witnessed apneas, gasping, choking.  Machine sometimes does not deliver enough pressure. Machine is old and not reliable.  Not on o2.  Has a nasal mask.   Plan: We extensively discussed the importance of treating OSA and the need to use PAP therapy.   Continue with current cpap machine. Current machine is old and sometimes does not deliver enough pressure. Recent hypersomnia as well. Plan to get a new autocpap machine.  May need to have a HST.     Patient was instructed to have mask, tubings, filter, reservoir cleaned at least once a week with soapy water.  Patient was instructed to call the office if he/she is having issues with the PAP device.    I advised patient to obtain sufficient amount of sleep --  7 to 8 hours at least in a 24 hr period.  Patient was advised to follow good sleep hygiene.  Patient was advised NOT to engage in activities requiring concentration and/or vigilance if he/she is and  sleepy.  Patient is NOT to drive if he/she is sleepy.

## 2016-06-08 NOTE — Assessment & Plan Note (Signed)
Stable. flonase prn.

## 2016-06-08 NOTE — Assessment & Plan Note (Signed)
Weight reduction 

## 2016-07-27 ENCOUNTER — Telehealth: Payer: Self-pay | Admitting: Pulmonary Disease

## 2016-07-27 DIAGNOSIS — G4733 Obstructive sleep apnea (adult) (pediatric): Secondary | ICD-10-CM

## 2016-07-27 NOTE — Telephone Encounter (Signed)
Order placed for new CPAP 06/07/16 with [facility]  LMTCB

## 2016-07-28 NOTE — Telephone Encounter (Signed)
Patient returning call - He can be reached at [phone]

## 2016-07-28 NOTE — Telephone Encounter (Signed)
lmomtcb x 1 for the pt 

## 2016-07-28 NOTE — Telephone Encounter (Signed)
Spoke with pt, requesting a hard copy rx to be mailed to him (verified home address on file).  Pt had an order sent to [facility] in June, but pt has a high deductible with his insurance and will be responsible for the entire cost of the cpap machine.  Pt states he has found one on cpap.com that is more affordable.    Rx has been printed and mailed to pt's home.  Nothing further needed.

## 2016-08-25 ENCOUNTER — Encounter: Payer: Self-pay | Admitting: Pulmonary Disease

## 2016-08-25 ENCOUNTER — Ambulatory Visit (INDEPENDENT_AMBULATORY_CARE_PROVIDER_SITE_OTHER): Payer: 59 | Admitting: Pulmonary Disease

## 2016-08-25 ENCOUNTER — Encounter (INDEPENDENT_AMBULATORY_CARE_PROVIDER_SITE_OTHER): Payer: Self-pay

## 2016-08-25 DIAGNOSIS — E669 Obesity, unspecified: Secondary | ICD-10-CM | POA: Diagnosis not present

## 2016-08-25 DIAGNOSIS — J3089 Other allergic rhinitis: Secondary | ICD-10-CM | POA: Diagnosis not present

## 2016-08-25 DIAGNOSIS — G4733 Obstructive sleep apnea (adult) (pediatric): Secondary | ICD-10-CM | POA: Diagnosis not present

## 2016-08-25 NOTE — Progress Notes (Signed)
Subjective:    Patient ID: Nicholas Moore, male    DOB: 02-Jun-1956, 60 y.o.   MRN: [id number]  HPI   This is the case of Nicholas Moore, 60 y.o. Male, who was referred by Dr. Alysia Penna in consultation regarding OSA.   As you very well know, patient was dxed with osa 10 yrs ago.  Had a PSG in [place], unsure severity. Had a rpt sleep study 5 yrs ago.  On autocpap 12-16.  Has hypersomnia recently.  Occasional snoring. (-) witnessed apneas, gasping, choking.  Machine sometimes does not deliver enough pressure. Machine is old and not reliable.  Not on o2.  Has a nasal mask.   Non smoker. No asthma or copd.   ROV 08/25/16 Pt is here for f/u on his osa. Since last seen, he ended up getting cpap machine off the internet as he has a lot of deductibles. He is on autocpap.  No DL has been done. Feels better using it. More energy. Still with some fatigue and hypersomnia. He is over all better but not as "great" as he had expected it to be.  Has not been admitted nor has been on abx since last seen.     Review of Systems  Constitutional: Negative for fever and unexpected weight change.  HENT: Positive for congestion. Negative for dental problem, ear pain, nosebleeds, postnasal drip, rhinorrhea, sinus pressure, sneezing, sore throat and trouble swallowing.   Eyes: Negative for redness and itching.  Respiratory: Negative for cough, chest tightness, shortness of breath and wheezing.   Cardiovascular: Negative for palpitations and leg swelling.  Gastrointestinal: Negative for nausea and vomiting.  Genitourinary: Negative for dysuria.  Musculoskeletal: Negative for joint swelling.  Skin: Negative for rash.  Neurological: Negative for headaches.  Hematological: Does not bruise/bleed easily.  Psychiatric/Behavioral: Negative for dysphoric mood. The patient is not nervous/anxious.  Objective:   Physical Exam  Vitals:  Vitals:   08/25/16 1613  BP: 138/82  Pulse: 63  SpO2: 96%  Weight: 239 lb (108.4 kg)  Height: 5\' 11"  (1.803 m)    Constitutional/General:  Pleasant, well-nourished, well-developed, not in any distress,  Comfortably seating.  Well kempt  Body mass index is 33.33 kg/m. Wt Readings from Last 3 Encounters:  08/25/16 239 lb (108.4 kg)  06/07/16 238 lb 9.6 oz (108.2 kg)  01/01/16 246 lb (111.6 kg)    Neck circumference: 17.5 inches HEENT: Pupils equal and reactive to light and accommodation. Anicteric sclerae. Normal nasal mucosa.   No oral  lesions,  mouth clear,  oropharynx clear, no postnasal drip. (-) Oral thrush. No dental caries.  Airway - Mallampati class III  Neck: No masses. Midline trachea. No JVD, (-) LAD. (-) bruits appreciated.  Respiratory/Chest: Grossly normal chest. (-) deformity. (-) Accessory muscle use.  Symmetric expansion. (-) Tenderness on palpation.  Resonant on percussion.  Diminished BS on both lower lung zones. (-) wheezing, crackles, rhonchi (-) egophony  Cardiovascular: Regular rate and  rhythm, heart sounds normal, no murmur or gallops, no peripheral edema  Gastrointestinal:  Normal bowel sounds. Soft, non-tender. No  hepatosplenomegaly.  (-) masses.   Musculoskeletal:  Normal muscle tone. Normal gait.   Extremities: Grossly normal. (-) clubbing, cyanosis.  (-) edema  Skin: (-) rash,lesions seen.   Neurological/Psychiatric : alert, oriented to time, place, person. Normal mood and affect           Assessment & Plan:  Obstructive sleep apnea patient was dxed with osa 10 yrs ago. AHI 15.  Had a PSG in [place], unsure severity. Had a rpt sleep study 5 yrs ago. Optimal pressure was 14 cm water.  On autocpap 12-16.  Has hypersomnia recently.  Occasional snoring. (-) witnessed apneas, gasping, choking.  Machine sometimes does not deliver enough pressure. Machine is old and not reliable.  Not on o2.  Has a nasal mask.   Pt got his machine off the internet as it would be too costly if insurance paid for it. On autocpap 5-15 cm water. Feels better using it. More energy.  He thought he would feel better ("great") though. Still with some hypersomnia.   Plan: We extensively discussed the importance of treating OSA and the need to use PAP therapy.   Continue with current cpap machine, autocpap 5-15 cm water. I told pt to bring machine to [facility] to get a DL.  If DL OK, will observe how the hypersomnia/fatigue does over time. May need cbc,cmp, TFT to check for other causes of fatigue.  If DL is not good, may need BipaP machine but will need a bipap study. Will need to discuss with him.    Patient was instructed to have mask, tubings, filter, reservoir cleaned at least once a week with soapy water.  Patient was instructed to call the office if he/she is having issues with the PAP device.    I advised patient to obtain sufficient amount of sleep --  7 to 8 hours at least in a 24 hr period.  Patient was advised to follow good sleep hygiene.  Patient was advised NOT to engage in activities requiring concentration and/or vigilance if he/she is and  sleepy.  Patient is NOT to drive if he/she is sleepy.       Allergic rhinitis Stable. flonase prn.   Obesity Weight reduction     Patient will follow up with me in  6 months.     Monica Becton, MD 08/26/2016   6:24 AM Pulmonary and [place] Pager: [phone] Office: [phone], Fax: [phone]

## 2016-08-25 NOTE — Patient Instructions (Signed)
  It was a pleasure taking care of you today!  Continue using your CPAP machine.  Please bring her machine to [facility] so they can do a download. We will call you once we have the download.  Please make sure you use your CPAP device everytime you sleep.  We will monitor the usage of your machine per your insurance requirement.  Your insurance company may take the machine from you if you are not using it regularly.   Please clean the mask, tubings, filter, water reservoir with soapy water every week.  Please use distilled water for the water reservoir.   Please call the office or your machine provider (DME company) if you are having issues with the device.   Return to clinic in 6 months

## 2016-08-26 ENCOUNTER — Telehealth: Payer: Self-pay | Admitting: Pulmonary Disease

## 2016-08-26 NOTE — Assessment & Plan Note (Signed)
Stable. flonase prn.

## 2016-08-26 NOTE — Telephone Encounter (Signed)
lmtcb

## 2016-08-26 NOTE — Assessment & Plan Note (Addendum)
patient was dxed with osa 10 yrs ago. AHI 15.  Had a PSG in [place], unsure severity. Had a rpt sleep study 5 yrs ago. Optimal pressure was 14 cm water.  On autocpap 12-16.  Has hypersomnia recently.  Occasional snoring. (-) witnessed apneas, gasping, choking.  Machine sometimes does not deliver enough pressure. Machine is old and not reliable.  Not on o2.  Has a nasal mask.   Pt got his machine off the internet as it would be too costly if insurance paid for it. On autocpap 5-15 cm water. Feels better using it. More energy.  He thought he would feel better ("great") though. Still with some hypersomnia.   Plan: We extensively discussed the importance of treating OSA and the need to use PAP therapy.   Continue with current cpap machine, autocpap 5-15 cm water. I told pt to bring machine to [facility] to get a DL.  If DL OK, will observe how the hypersomnia/fatigue does over time. May need cbc,cmp, TFT to check for other causes of fatigue.  If DL is not good, may need BipaP machine but will need a bipap study. Will need to discuss with him.    Patient was instructed to have mask, tubings, filter, reservoir cleaned at least once a week with soapy water.  Patient was instructed to call the office if he/she is having issues with the PAP device.    I advised patient to obtain sufficient amount of sleep --  7 to 8 hours at least in a 24 hr period.  Patient was advised to follow good sleep hygiene.  Patient was advised NOT to engage in activities requiring concentration and/or vigilance if he/she is and  sleepy.  Patient is NOT to drive if he/she is sleepy.

## 2016-08-26 NOTE — Assessment & Plan Note (Signed)
Weight reduction 

## 2016-08-27 NOTE — Telephone Encounter (Signed)
Called and spoke with pt and he was advised to send an email with the phone number to Caryn Section and Lahoma Crocker so we can call and talk to them about the software that is needed to be able to get a download for the pt.  Will wait to get email to the pt.

## 2016-09-01 NOTE — Telephone Encounter (Signed)
lmtcb for pt to follow up on this information.

## 2016-09-02 NOTE — Telephone Encounter (Signed)
lmtcb x2 for pt. 

## 2016-09-03 NOTE — Telephone Encounter (Signed)
Spoke with pt. States that he does not have this information. He is currently driving home from vacation. States that once he gets this information, he will call us.

## 2016-09-09 NOTE — Telephone Encounter (Signed)
lmtcb x1 for pt. 

## 2016-09-13 NOTE — Telephone Encounter (Signed)
lmtcb x2 for pt. 

## 2016-09-14 NOTE — Telephone Encounter (Signed)
lmtcb x3 for pt. Per triage protocol, message will be closed.  

## 2016-12-10 ENCOUNTER — Other Ambulatory Visit: Payer: Self-pay | Admitting: Family Medicine

## 2017-01-13 ENCOUNTER — Other Ambulatory Visit: Payer: 59

## 2017-01-17 ENCOUNTER — Other Ambulatory Visit (INDEPENDENT_AMBULATORY_CARE_PROVIDER_SITE_OTHER): Payer: 59

## 2017-01-17 DIAGNOSIS — Z0000 Encounter for general adult medical examination without abnormal findings: Secondary | ICD-10-CM | POA: Diagnosis not present

## 2017-01-17 LAB — CBC WITH DIFFERENTIAL/PLATELET
BASOS PCT: 0.4 % (ref 0.0–3.0)
Basophils Absolute: 0 10*3/uL (ref 0.0–0.1)
EOS PCT: 2 % (ref 0.0–5.0)
Eosinophils Absolute: 0.1 10*3/uL (ref 0.0–0.7)
HCT: 46.3 % (ref 39.0–52.0)
HEMOGLOBIN: 15.8 g/dL (ref 13.0–17.0)
LYMPHS ABS: 2.2 10*3/uL (ref 0.7–4.0)
Lymphocytes Relative: 33.6 % (ref 12.0–46.0)
MCHC: 34.2 g/dL (ref 30.0–36.0)
MCV: 90.5 fl (ref 78.0–100.0)
MONO ABS: 0.5 10*3/uL (ref 0.1–1.0)
Monocytes Relative: 8.5 % (ref 3.0–12.0)
NEUTROS ABS: 3.6 10*3/uL (ref 1.4–7.7)
NEUTROS PCT: 55.5 % (ref 43.0–77.0)
Platelets: 245 10*3/uL (ref 150.0–400.0)
RBC: 5.12 Mil/uL (ref 4.22–5.81)
RDW: 14.1 % (ref 11.5–15.5)
WBC: 6.4 10*3/uL (ref 4.0–10.5)

## 2017-01-17 LAB — BASIC METABOLIC PANEL
BUN: 16 mg/dL (ref 6–23)
CHLORIDE: 105 meq/L (ref 96–112)
CO2: 30 meq/L (ref 19–32)
Calcium: 9.2 mg/dL (ref 8.4–10.5)
Creatinine, Ser: 0.86 mg/dL (ref 0.40–1.50)
GFR: 96.37 mL/min (ref >60.00)
GLUCOSE: 101 mg/dL — AB (ref 70–99)
POTASSIUM: 4.6 meq/L (ref 3.5–5.1)
Sodium: 139 mEq/L (ref 135–145)

## 2017-01-17 LAB — POC URINALSYSI DIPSTICK (AUTOMATED)
Bilirubin, UA: NEGATIVE
Glucose, UA: NEGATIVE
KETONES UA: NEGATIVE
Leukocytes, UA: NEGATIVE
NITRITE UA: NEGATIVE
PH UA: 6
Protein, UA: NEGATIVE
RBC UA: NEGATIVE
Spec Grav, UA: 1.015
Urobilinogen, UA: 0.2

## 2017-01-17 LAB — HEPATIC FUNCTION PANEL
ALT: 21 U/L (ref 0–53)
AST: 19 U/L (ref 0–37)
Albumin: 4.1 g/dL (ref 3.5–5.2)
Alkaline Phosphatase: 62 U/L (ref 39–117)
BILIRUBIN TOTAL: 0.7 mg/dL (ref 0.2–1.2)
Bilirubin, Direct: 0.2 mg/dL (ref 0.0–0.3)
Total Protein: 6.6 g/dL (ref 6.0–8.3)

## 2017-01-17 LAB — LIPID PANEL
Cholesterol: 127 mg/dL (ref 0–200)
HDL: 41.2 mg/dL (ref >39.00)
LDL Cholesterol: 73 mg/dL (ref 0–99)
NONHDL: 85.97
Total CHOL/HDL Ratio: 3
Triglycerides: 64 mg/dL (ref 0.0–149.0)
VLDL: 12.8 mg/dL (ref 0.0–40.0)

## 2017-01-17 LAB — PSA: PSA: 0.26 ng/mL (ref 0.10–4.00)

## 2017-01-17 LAB — TSH: TSH: 2.34 u[IU]/mL (ref 0.35–4.50)

## 2017-01-20 ENCOUNTER — Ambulatory Visit (INDEPENDENT_AMBULATORY_CARE_PROVIDER_SITE_OTHER): Payer: 59

## 2017-01-20 ENCOUNTER — Ambulatory Visit (INDEPENDENT_AMBULATORY_CARE_PROVIDER_SITE_OTHER): Payer: 59 | Admitting: Family Medicine

## 2017-01-20 ENCOUNTER — Encounter: Payer: Self-pay | Admitting: Family Medicine

## 2017-01-20 VITALS — BP 118/75 | HR 54 | Temp 98.2°F | Ht 71.0 in | Wt 240.0 lb

## 2017-01-20 DIAGNOSIS — Z23 Encounter for immunization: Secondary | ICD-10-CM | POA: Diagnosis not present

## 2017-01-20 DIAGNOSIS — Z0000 Encounter for general adult medical examination without abnormal findings: Secondary | ICD-10-CM

## 2017-01-20 NOTE — Addendum Note (Signed)
Addended by: Aggie Hacker A on: 01/20/2017 12:01 PM   Modules accepted: Orders

## 2017-01-20 NOTE — Progress Notes (Signed)
Pre visit review using our clinic review tool, if applicable. No additional management support is needed unless otherwise documented below in the visit note. 

## 2017-01-20 NOTE — Progress Notes (Signed)
   Subjective:    Patient ID: Nicholas Moore, male    DOB: July 16, 1956, 61 y.o.   MRN: [id number]  HPI 61 yr old male for a well exam. He feels great. He and his wife have been doing cross fit workouts 3-4 days a week. He sees Dr. Vernie Shanks regularly for urologic exams.    Review of Systems  Constitutional: Negative.   HENT: Negative.   Eyes: Negative.   Respiratory: Negative.   Cardiovascular: Negative.   Gastrointestinal: Negative.   Genitourinary: Negative.   Musculoskeletal: Negative.   Skin: Negative.   Neurological: Negative.   Psychiatric/Behavioral: Negative.        Objective:   Physical Exam  Constitutional: He is oriented to person, place, and time. He appears well-developed and well-nourished. No distress.  HENT:  Head: Normocephalic and atraumatic.  Right Ear: External ear normal.  Left Ear: External ear normal.  Nose: Nose normal.  Mouth/Throat: Oropharynx is clear and moist. No oropharyngeal exudate.  Eyes: Conjunctivae and EOM are normal. Pupils are equal, round, and reactive to light. Right eye exhibits no discharge. Left eye exhibits no discharge. No scleral icterus.  Neck: Neck supple. No JVD present. No tracheal deviation present. No thyromegaly present.  Cardiovascular: Normal rate, regular rhythm, normal heart sounds and intact distal pulses.  Exam reveals no gallop and no friction rub.   No murmur heard. Pulmonary/Chest: Effort normal and breath sounds normal. No respiratory distress. He has no wheezes. He has no rales. He exhibits no tenderness.  Abdominal: Soft. Bowel sounds are normal. He exhibits no distension and no mass. There is no tenderness. There is no rebound and no guarding.  Musculoskeletal: Normal range of motion. He exhibits no edema or tenderness.  Lymphadenopathy:    He has no cervical adenopathy.  Neurological: He is alert and oriented to person, place, and time. He has normal reflexes. No cranial nerve deficit. He exhibits normal muscle  tone. Coordination normal.  Skin: Skin is warm and dry. No rash noted. He is not diaphoretic. No erythema. No pallor.  Psychiatric: He has a normal mood and affect. His behavior is normal. Judgment and thought content normal.          Assessment & Plan:  Well exam. We discussed diet and exercise.  Alysia Penna, MD

## 2017-04-06 ENCOUNTER — Ambulatory Visit (INDEPENDENT_AMBULATORY_CARE_PROVIDER_SITE_OTHER): Payer: 59 | Admitting: Internal Medicine

## 2017-04-06 ENCOUNTER — Encounter: Payer: Self-pay | Admitting: Internal Medicine

## 2017-04-06 VITALS — BP 110/70 | HR 71 | Temp 99.0°F | Ht 71.0 in | Wt 237.2 lb

## 2017-04-06 DIAGNOSIS — J22 Unspecified acute lower respiratory infection: Secondary | ICD-10-CM

## 2017-04-06 NOTE — Progress Notes (Signed)
Chief Complaint  Patient presents with  . Sore Throat    all sx started three days ago  . Cough    chest tightness   . Ear Pain  . Generalized Body Aches    HPI: Nicholas Moore 61 y.o. pcp  Not convenuent appt onset 4 days ago of congestion and had a cough body aches some headache behind the eyes without significant fever and chills but some body aches. He has nasal congestion coughing worse at night just feels tight but no real shortness of breath no hemoptysis no history of underlying lung disease or tobacco. Has taken Advil for body aches. Is also taking some Claritin and has Flonase.  Works in [employer] with many people did have the flu vaccine this year. ROS: See pertinent positives and negatives per HPI.  Past Medical History:  Diagnosis Date  . Allergy   . Anxiety   . ED (erectile dysfunction)   . GERD (gastroesophageal reflux disease)   . History of colonic polyps   . History of hypotestosteronemia    sees Dr. Preston Fleeting  . Hyperlipidemia   . Hypertension   . Melanoma ([place])    dr Allyson Sabal   . 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 Roque Cash   . Sleep apnea    sees Dr. Danton Sewer, wears CPAP    Family History  Problem Relation Age of Onset  . Alcohol abuse    . Coronary artery disease    . Hypertension    . Emphysema    . Colon cancer Neg Hx   . Pancreatic cancer Neg Hx   . Stomach cancer Neg Hx     Social History   Social History  . Marital status: Married    Spouse name: N/A  . Number of children: N/A  . Years of education: N/A   Social History Main Topics  . Smoking status: Never Smoker  . Smokeless tobacco: Never Used  . Alcohol use 0.0 oz/week     Comment: occ  . Drug use: No  . Sexual activity: Not Asked   Other Topics Concern  . None   Social History Narrative  . None    Outpatient Medications Prior to Visit  Medication Sig Dispense Refill  . atorvastatin (LIPITOR) 40 MG tablet TAKE ONE  TABLET BY MOUTH ONE TIME DAILY 90 tablet 3  . cabergoline (DOSTINEX) 0.5 MG tablet Take 0.5 mg by mouth 2 (two) times a week.    . fluticasone (FLONASE ALLERGY RELIEF) 50 MCG/ACT nasal spray Place 2 sprays into the nose daily as needed.     . Multiple Vitamin (MULTIVITAMIN) tablet Take 1 tablet by mouth daily.      . Omega-3 Fatty Acids (FISH OIL PO) Take by mouth daily.    . sertraline (ZOLOFT) 100 MG tablet TAKE ONE TABLET BY MOUTH ONE TIME DAILY 90 tablet 3  . Testosterone (AXIRON) 30 MG/ACT SOLN Place 1 application onto the skin daily. 90 mL 0   No facility-administered medications prior to visit.      EXAM:  BP 110/70 (BP Location: Left Arm, Patient Position: Sitting, Cuff Size: Normal)   Pulse 71   Temp 99 F (37.2 C) (Oral)   Ht 5\' 11"  (1.803 m)   Wt 237 lb 3.2 oz (107.6 kg)   SpO2 96%   BMI 33.08 kg/m   Body mass index is 33.08 kg/m. WDWN in NAD  quiet respirations; congested  . Non  toxic . HEENT: Normocephalic ;atraumatic , Eyes;  PERRL, EOMs  Full, lids and conjunctiva clear,,Ears: no deformities, canals nl, TM landmarks normal, Nose: no deformity or discharge but congested;face non  tender Mouth : OP clear without lesion or edema . Cobblestoning  Neck: Supple without adenopathy or masses or bruits Chest:  Clear to A&P without wheezes rales or rhonchi  CV:  S1-S2 no gallops or murmurs peripheral perfusion is normal Skin :nl perfusion and no acute rashes  ocass bronchial sounding cough   ASSESSMENT AND PLAN:  Discussed the following assessment and plan:  Acute respiratory infection - exam reassuring  viral most likely poss low grade flu illness expectant managment.  Expectant management for alarm symptoms or progression. At this time I feel uncomplicated and he is contagious. -Patient advised to return or notify health care team  if symptoms worsen ,persist or new concerns arise.  Patient Instructions  This acts like a viral respiratory infection which should  resolve on its own will cough may last 2-3 weeks and the worst part of the illness is the first week. After this weekend you should be feeling better even if you're still coughing. If you get relapsing high fever chills shortness of breath or after the weekend you aren't feeling better generally contact us for advice. Your exam is reassuring and no signs of pneumonia. Can use saline nose spray decongestants and your Flonase in the day Afrin at night if needed no more than 2 days for nasal congestion. You can use over-the-counter Mucinex DM for cough suppression and chlorpheniramine for postnasal drainage but could cause drowsiness. Tylenol or ibuprofen for body aches. Your bodyache should be subsided by the end of the week. It is possible this is a low grade flu illness but you should get better with time.    Standley Brooking. Porfiria Heinrich M.D.

## 2017-04-06 NOTE — Patient Instructions (Signed)
This acts like a viral respiratory infection which should resolve on its own will cough may last 2-3 weeks and the worst part of the illness is the first week. After this weekend you should be feeling better even if you're still coughing. If you get relapsing high fever chills shortness of breath or after the weekend you aren't feeling better generally contact us for advice. Your exam is reassuring and no signs of pneumonia. Can use saline nose spray decongestants and your Flonase in the day Afrin at night if needed no more than 2 days for nasal congestion. You can use over-the-counter Mucinex DM for cough suppression and chlorpheniramine for postnasal drainage but could cause drowsiness. Tylenol or ibuprofen for body aches. Your bodyache should be subsided by the end of the week. It is possible this is a low grade flu illness but you should get better with time.

## 2017-08-17 ENCOUNTER — Ambulatory Visit (INDEPENDENT_AMBULATORY_CARE_PROVIDER_SITE_OTHER): Payer: 59 | Admitting: Family Medicine

## 2017-08-17 ENCOUNTER — Encounter: Payer: Self-pay | Admitting: Family Medicine

## 2017-08-17 VITALS — BP 110/72 | Temp 98.5°F | Ht 71.0 in | Wt 237.0 lb

## 2017-08-17 DIAGNOSIS — R5383 Other fatigue: Secondary | ICD-10-CM | POA: Diagnosis not present

## 2017-08-17 LAB — CBC WITH DIFFERENTIAL/PLATELET
BASOS PCT: 0.8 % (ref 0.0–3.0)
Basophils Absolute: 0 10*3/uL (ref 0.0–0.1)
EOS ABS: 0.1 10*3/uL (ref 0.0–0.7)
Eosinophils Relative: 2.7 % (ref 0.0–5.0)
HEMATOCRIT: 48.6 % (ref 39.0–52.0)
Hemoglobin: 16 g/dL (ref 13.0–17.0)
LYMPHS ABS: 1.4 10*3/uL (ref 0.7–4.0)
LYMPHS PCT: 27.8 % (ref 12.0–46.0)
MCHC: 32.9 g/dL (ref 30.0–36.0)
MCV: 92.6 fl (ref 78.0–100.0)
Monocytes Absolute: 0.4 10*3/uL (ref 0.1–1.0)
Monocytes Relative: 8.1 % (ref 3.0–12.0)
NEUTROS ABS: 3.1 10*3/uL (ref 1.4–7.7)
Neutrophils Relative %: 60.6 % (ref 43.0–77.0)
PLATELETS: 232 10*3/uL (ref 150.0–400.0)
RBC: 5.25 Mil/uL (ref 4.22–5.81)
RDW: 13.7 % (ref 11.5–15.5)
WBC: 5.2 10*3/uL (ref 4.0–10.5)

## 2017-08-17 LAB — HEPATIC FUNCTION PANEL
ALK PHOS: 58 U/L (ref 39–117)
ALT: 22 U/L (ref 0–53)
AST: 21 U/L (ref 0–37)
Albumin: 4.2 g/dL (ref 3.5–5.2)
BILIRUBIN DIRECT: 0.2 mg/dL (ref 0.0–0.3)
TOTAL PROTEIN: 6.7 g/dL (ref 6.0–8.3)
Total Bilirubin: 0.9 mg/dL (ref 0.2–1.2)

## 2017-08-17 LAB — BASIC METABOLIC PANEL
BUN: 18 mg/dL (ref 6–23)
CALCIUM: 9.5 mg/dL (ref 8.4–10.5)
CHLORIDE: 105 meq/L (ref 96–112)
CO2: 29 meq/L (ref 19–32)
Creatinine, Ser: 0.96 mg/dL (ref 0.40–1.50)
GFR: 84.72 mL/min (ref >60.00)
Glucose, Bld: 97 mg/dL (ref 70–99)
POTASSIUM: 4.5 meq/L (ref 3.5–5.1)
SODIUM: 139 meq/L (ref 135–145)

## 2017-08-17 NOTE — Progress Notes (Signed)
   Subjective:    Patient ID: Nicholas Moore, male    DOB: [birth year], 61 y.o.   MRN: [id number]  HPI Here for several months of fatigue. He often feels tired and often takes naps, and he says he has never napped before in his life. He sleeps well at night, but he uses a CPAP and this has not been calibrated for years. No change in diet. He is under some stress with his job.    Review of Systems  Constitutional: Positive for fatigue. Negative for activity change, appetite change and unexpected weight change.  Respiratory: Negative.   Cardiovascular: Negative.   Gastrointestinal: Negative.   Endocrine: Negative.   Genitourinary: Negative.   Neurological: Negative.        Objective:   Physical Exam  Constitutional: He is oriented to person, place, and time. He appears well-developed and well-nourished.  Neck: No thyromegaly present.  Cardiovascular: Normal rate, regular rhythm, normal heart sounds and intact distal pulses.   Pulmonary/Chest: Effort normal and breath sounds normal. No respiratory distress. He has no wheezes. He has no rales.  Lymphadenopathy:    He has no cervical adenopathy.  Neurological: He is alert and oriented to person, place, and time.          Assessment & Plan:  Fatigue, we will get labs today to work this up. I also suggested he have his CPAP machine checked.  Alysia Penna, MD

## 2017-08-17 NOTE — Patient Instructions (Signed)
WE NOW OFFER   [facility] FAST TRACK!!!  SAME DAY Appointments for ACUTE CARE  Such as: Sprains, Injuries, cuts, abrasions, rashes, muscle pain, joint pain, back pain Colds, flu, sore throats, headache, allergies, cough, fever  Ear pain, sinus and eye infections Abdominal pain, nausea, vomiting, diarrhea, upset stomach Animal/insect bites  3 Easy Ways to Schedule: Walk-In Scheduling Call in scheduling Mychart Sign-up: [email or web address]         

## 2017-08-18 LAB — VITAMIN B12: Vitamin B-12: 388 pg/mL (ref 211–911)

## 2017-08-18 LAB — TSH: TSH: 2.39 u[IU]/mL (ref 0.35–4.50)

## 2017-08-18 LAB — TESTOSTERONE: Testosterone: 373.63 ng/dL (ref 300.00–890.00)

## 2017-09-15 ENCOUNTER — Encounter: Payer: Self-pay | Admitting: Family Medicine

## 2018-01-17 ENCOUNTER — Other Ambulatory Visit: Payer: Self-pay | Admitting: Dermatology

## 2018-03-05 ENCOUNTER — Other Ambulatory Visit: Payer: Self-pay | Admitting: Family Medicine

## 2018-03-08 ENCOUNTER — Ambulatory Visit (INDEPENDENT_AMBULATORY_CARE_PROVIDER_SITE_OTHER): Payer: 59 | Admitting: Family Medicine

## 2018-03-08 ENCOUNTER — Encounter: Payer: Self-pay | Admitting: Family Medicine

## 2018-03-08 VITALS — BP 118/66 | Temp 98.4°F | Ht 71.0 in | Wt 238.4 lb

## 2018-03-08 DIAGNOSIS — Z0000 Encounter for general adult medical examination without abnormal findings: Secondary | ICD-10-CM

## 2018-03-08 MED ORDER — SERTRALINE HCL 100 MG PO TABS: 50.0000 mg | ORAL_TABLET | Freq: Every day | ORAL | 0 refills | Status: DC

## 2018-03-08 NOTE — Addendum Note (Signed)
Addended by: Myriam Forehand on: 03/08/2018 02:48 PM   Modules accepted: Orders

## 2018-03-08 NOTE — Progress Notes (Signed)
   Subjective:    Patient ID: Nicholas Moore, male    DOB: 1956/05/04, 62 y.o.   MRN: [id number]  HPI Here for a well exam. He feels great. In fact he asks if we can cut the dose of his Zoloft down. His moods are stable.    Review of Systems  Constitutional: Negative.   HENT: Negative.   Eyes: Negative.   Respiratory: Negative.   Cardiovascular: Negative.   Gastrointestinal: Negative.   Genitourinary: Negative.   Musculoskeletal: Negative.   Skin: Negative.   Neurological: Negative.   Psychiatric/Behavioral: Negative.        Objective:   Physical Exam  Constitutional: He is oriented to person, place, and time. He appears well-developed and well-nourished. No distress.  HENT:  Head: Normocephalic and atraumatic.  Right Ear: External ear normal.  Left Ear: External ear normal.  Nose: Nose normal.  Mouth/Throat: Oropharynx is clear and moist. No oropharyngeal exudate.  Eyes: Conjunctivae and EOM are normal. Pupils are equal, round, and reactive to light. Right eye exhibits no discharge. Left eye exhibits no discharge. No scleral icterus.  Neck: Neck supple. No JVD present. No tracheal deviation present. No thyromegaly present.  Cardiovascular: Normal rate, regular rhythm, normal heart sounds and intact distal pulses. Exam reveals no gallop and no friction rub.  No murmur heard. Pulmonary/Chest: Effort normal and breath sounds normal. No respiratory distress. He has no wheezes. He has no rales. He exhibits no tenderness.  Abdominal: Soft. Bowel sounds are normal. He exhibits no distension and no mass. There is no tenderness. There is no rebound and no guarding.  Genitourinary: Rectum normal, prostate normal and penis normal. Rectal exam shows guaiac negative stool. No penile tenderness.  Musculoskeletal: Normal range of motion. He exhibits no edema or tenderness.  Lymphadenopathy:    He has no cervical adenopathy.  Neurological: He is alert and oriented to person, place, and time.  He has normal reflexes. No cranial nerve deficit. He exhibits normal muscle tone. Coordination normal.  Skin: Skin is warm and dry. No rash noted. He is not diaphoretic. No erythema. No pallor.  Psychiatric: He has a normal mood and affect. His behavior is normal. Judgment and thought content normal.          Assessment & Plan:  Well exam. We discussed diet and exercise. Set up fasting labs. He will decrease the Zoloft to 1/2 a tab (50 mg) daily.  Alysia Penna, MD

## 2018-03-10 ENCOUNTER — Other Ambulatory Visit (INDEPENDENT_AMBULATORY_CARE_PROVIDER_SITE_OTHER): Payer: 59

## 2018-03-10 DIAGNOSIS — Z0000 Encounter for general adult medical examination without abnormal findings: Secondary | ICD-10-CM | POA: Diagnosis not present

## 2018-03-10 LAB — POC URINALSYSI DIPSTICK (AUTOMATED)
Glucose, UA: NEGATIVE
Ketones, UA: NEGATIVE
Leukocytes, UA: NEGATIVE
NITRITE UA: NEGATIVE
PH UA: 5.5 (ref 5.0–8.0)
PROTEIN UA: NEGATIVE
UROBILINOGEN UA: 0.2 U/dL

## 2018-03-10 LAB — BASIC METABOLIC PANEL
BUN: 20 mg/dL (ref 6–23)
CHLORIDE: 106 meq/L (ref 96–112)
CO2: 29 meq/L (ref 19–32)
CREATININE: 1.05 mg/dL (ref 0.40–1.50)
Calcium: 9.5 mg/dL (ref 8.4–10.5)
GFR: 76.25 mL/min (ref >60.00)
Glucose, Bld: 90 mg/dL (ref 70–99)
Potassium: 4.4 mEq/L (ref 3.5–5.1)
SODIUM: 141 meq/L (ref 135–145)

## 2018-03-10 LAB — HEPATIC FUNCTION PANEL
ALBUMIN: 4.2 g/dL (ref 3.5–5.2)
ALT: 24 U/L (ref 0–53)
AST: 21 U/L (ref 0–37)
Alkaline Phosphatase: 62 U/L (ref 39–117)
BILIRUBIN TOTAL: 0.6 mg/dL (ref 0.2–1.2)
Bilirubin, Direct: 0.1 mg/dL (ref 0.0–0.3)
Total Protein: 6.4 g/dL (ref 6.0–8.3)

## 2018-03-10 LAB — LIPID PANEL
CHOL/HDL RATIO: 3
CHOLESTEROL: 114 mg/dL (ref 0–200)
HDL: 40.5 mg/dL (ref >39.00)
LDL CALC: 62 mg/dL (ref 0–99)
NonHDL: 73.73
Triglycerides: 61 mg/dL (ref 0.0–149.0)
VLDL: 12.2 mg/dL (ref 0.0–40.0)

## 2018-03-10 LAB — CBC WITH DIFFERENTIAL/PLATELET
BASOS ABS: 0.1 10*3/uL (ref 0.0–0.1)
BASOS PCT: 0.9 % (ref 0.0–3.0)
Eosinophils Absolute: 0.1 10*3/uL (ref 0.0–0.7)
Eosinophils Relative: 2.1 % (ref 0.0–5.0)
HEMATOCRIT: 46.2 % (ref 39.0–52.0)
Hemoglobin: 15.9 g/dL (ref 13.0–17.0)
LYMPHS PCT: 29.7 % (ref 12.0–46.0)
Lymphs Abs: 1.6 10*3/uL (ref 0.7–4.0)
MCHC: 34.4 g/dL (ref 30.0–36.0)
MCV: 90.7 fl (ref 78.0–100.0)
Monocytes Absolute: 0.5 10*3/uL (ref 0.1–1.0)
Monocytes Relative: 8.5 % (ref 3.0–12.0)
NEUTROS ABS: 3.2 10*3/uL (ref 1.4–7.7)
NEUTROS PCT: 58.8 % (ref 43.0–77.0)
PLATELETS: 237 10*3/uL (ref 150.0–400.0)
RBC: 5.1 Mil/uL (ref 4.22–5.81)
RDW: 13.6 % (ref 11.5–15.5)
WBC: 5.5 10*3/uL (ref 4.0–10.5)

## 2018-03-10 LAB — PSA: PSA: 0.26 ng/mL (ref 0.10–4.00)

## 2018-03-10 LAB — TSH: TSH: 3.15 u[IU]/mL (ref 0.35–4.50)

## 2018-04-03 ENCOUNTER — Telehealth: Payer: Self-pay | Admitting: Family Medicine

## 2018-04-03 NOTE — Telephone Encounter (Signed)
[employer] form to be filled out.  Placed in Dr's folder.  Mail in attached envelope to [employer].

## 2018-04-04 NOTE — Telephone Encounter (Signed)
It's up here in Dr. Barbie Banner folder.

## 2018-04-04 NOTE — Telephone Encounter (Signed)
Tammy do you have this?

## 2018-04-05 ENCOUNTER — Encounter: Payer: Self-pay | Admitting: Family Medicine

## 2018-04-05 NOTE — Telephone Encounter (Signed)
Placed in Dr. Fry's RED folder  

## 2018-04-05 NOTE — Telephone Encounter (Signed)
The form is ready  

## 2018-04-06 NOTE — Telephone Encounter (Signed)
Placed to be mailed. Copies placed to be scanned into pt's chart. Called pt and left a VM that forms have been completed and placed to be mailed as requested.

## 2018-04-25 ENCOUNTER — Telehealth: Payer: Self-pay | Admitting: Family Medicine

## 2018-04-25 NOTE — Telephone Encounter (Signed)
Patient needs information mailed to [employer], envelope attached.

## 2018-04-25 NOTE — Telephone Encounter (Signed)
Done lab requested has been mailed.

## 2018-05-25 ENCOUNTER — Other Ambulatory Visit: Payer: Self-pay | Admitting: Family Medicine

## 2018-09-13 ENCOUNTER — Other Ambulatory Visit: Payer: Self-pay | Admitting: Family Medicine

## 2018-09-27 ENCOUNTER — Encounter: Payer: Self-pay | Admitting: Gastroenterology

## 2018-10-16 ENCOUNTER — Ambulatory Visit (AMBULATORY_SURGERY_CENTER): Payer: Self-pay | Admitting: *Deleted

## 2018-10-16 ENCOUNTER — Encounter: Payer: Self-pay | Admitting: Gastroenterology

## 2018-10-16 ENCOUNTER — Other Ambulatory Visit: Payer: Self-pay

## 2018-10-16 VITALS — Ht 69.5 in | Wt 233.0 lb

## 2018-10-16 DIAGNOSIS — Z86010 Personal history of colonic polyps: Secondary | ICD-10-CM

## 2018-10-16 MED ORDER — SUPREP BOWEL PREP KIT 17.5-3.13-1.6 GM/177ML PO SOLN: 1.0000 | Freq: Once | ORAL | 0 refills | Status: AC

## 2018-10-16 NOTE — Progress Notes (Signed)
Patient denies any allergies to egg or soy products. Patient denies complications with anesthesia/sedation.  Patient denies oxygen use at home and denies diet medications. Patient denies information on colonoscopy. 

## 2018-10-30 ENCOUNTER — Ambulatory Visit (AMBULATORY_SURGERY_CENTER): Payer: 59 | Admitting: Gastroenterology

## 2018-10-30 ENCOUNTER — Encounter: Payer: Self-pay | Admitting: Gastroenterology

## 2018-10-30 VITALS — BP 115/73 | HR 58 | Temp 97.5°F | Resp 13 | Ht 71.0 in | Wt 238.0 lb

## 2018-10-30 DIAGNOSIS — D123 Benign neoplasm of transverse colon: Secondary | ICD-10-CM | POA: Diagnosis not present

## 2018-10-30 DIAGNOSIS — D124 Benign neoplasm of descending colon: Secondary | ICD-10-CM | POA: Diagnosis not present

## 2018-10-30 DIAGNOSIS — Z86010 Personal history of colonic polyps: Secondary | ICD-10-CM

## 2018-10-30 HISTORY — PX: COLONOSCOPY: SHX174

## 2018-10-30 MED ORDER — SODIUM CHLORIDE 0.9 % IV SOLN: 500.0000 mL | Freq: Once | INTRAVENOUS | Status: DC

## 2018-10-30 NOTE — Progress Notes (Signed)
Report to PACU, RN, vss, BBS= Clear.  

## 2018-10-30 NOTE — Progress Notes (Signed)
Pt's states no medical or surgical changes since previsit or office visit. 

## 2018-10-30 NOTE — Patient Instructions (Signed)
*   handout on polyps, hemorrhoids given*  YOU HAD AN ENDOSCOPIC PROCEDURE TODAY AT [facility]:   Refer to the procedure report that was given to you for any specific questions about what was found during the examination.  If the procedure report does not answer your questions, please call your gastroenterologist to clarify.  If you requested that your care partner not be given the details of your procedure findings, then the procedure report has been included in a sealed envelope for you to review at your convenience later.  YOU SHOULD EXPECT: Some feelings of bloating in the abdomen. Passage of more gas than usual.  Walking can help get rid of the air that was put into your GI tract during the procedure and reduce the bloating. If you had a lower endoscopy (such as a colonoscopy or flexible sigmoidoscopy) you may notice spotting of blood in your stool or on the toilet paper. If you underwent a bowel prep for your procedure, you may not have a normal bowel movement for a few days.  Please Note:  You might notice some irritation and congestion in your nose or some drainage.  This is from the oxygen used during your procedure.  There is no need for concern and it should clear up in a day or so.  SYMPTOMS TO REPORT IMMEDIATELY:   Following lower endoscopy (colonoscopy or flexible sigmoidoscopy):  Excessive amounts of blood in the stool  Significant tenderness or worsening of abdominal pains  Swelling of the abdomen that is new, acute  Fever of 100F or higher   For urgent or emergent issues, a gastroenterologist can be reached at any hour by calling [phone].   DIET:  We do recommend a small meal at first, but then you may proceed to your regular diet.  Drink plenty of fluids but you should avoid alcoholic beverages for 24 hours.  ACTIVITY:  You should plan to take it easy for the rest of today and you should NOT DRIVE or use heavy machinery until tomorrow (because of the  sedation medicines used during the test).    FOLLOW UP: Our staff will call the number listed on your records the next business day following your procedure to check on you and address any questions or concerns that you may have regarding the information given to you following your procedure. If we do not reach you, we will leave a message.  However, if you are feeling well and you are not experiencing any problems, there is no need to return our call.  We will assume that you have returned to your regular daily activities without incident.  If any biopsies were taken you will be contacted by phone or by letter within the next 1-3 weeks.  Please call us at [phone] if you have not heard about the biopsies in 3 weeks.    SIGNATURES/CONFIDENTIALITY: You and/or your care partner have signed paperwork which will be entered into your electronic medical record.  These signatures attest to the fact that that the information above on your After Visit Summary has been reviewed and is understood.  Full responsibility of the confidentiality of this discharge information lies with you and/or your care-partner.

## 2018-10-30 NOTE — Op Note (Signed)
[facility] Patient Name: Nicholas Moore Procedure Date: 10/30/2018 10:18 AM MRN: [id number] Endoscopist: Ladene Artist , MD Age: 62 Referring MD:  Date of Birth: 10-01-1956 Gender: Male Account #: [id number] Procedure:                Colonoscopy Indications:              Surveillance: Personal history of adenomatous                            polyps on last colonoscopy 5 years ago Medicines:                Monitored Anesthesia Care Procedure:                Pre-Anesthesia Assessment:                           - Prior to the procedure, a History and Physical                            was performed, and patient medications and                            allergies were reviewed. The patient's tolerance of                            previous anesthesia was also reviewed. The risks                            and benefits of the procedure and the sedation                            options and risks were discussed with the patient.                            All questions were answered, and informed consent                            was obtained. Prior Anticoagulants: The patient has                            taken no previous anticoagulant or antiplatelet                            agents. ASA Grade Assessment: II - A patient with                            mild systemic disease. After reviewing the risks                            and benefits, the patient was deemed in                            satisfactory condition to undergo the procedure.  After obtaining informed consent, the colonoscope                            was passed under direct vision. Throughout the                            procedure, the patient's blood pressure, pulse, and                            oxygen saturations were monitored continuously. The                            Colonoscope was introduced through the anus and                            advanced to the the cecum,  identified by                            appendiceal orifice and ileocecal valve. The                            ileocecal valve, appendiceal orifice, and rectum                            were photographed. The quality of the bowel                            preparation was excellent. The colonoscopy was                            performed without difficulty. The patient tolerated                            the procedure well. Scope In: 10:27:22 AM Scope Out: 10:45:01 AM Scope Withdrawal Time: 0 hours 14 minutes 46 seconds  Total Procedure Duration: 0 hours 17 minutes 39 seconds  Findings:                 The perianal and digital rectal examinations were                            normal.                           A 6 mm polyp was found in the transverse colon. The                            polyp was sessile. The polyp was removed with a                            cold snare. Resection and retrieval were complete.                           Two sessile polyps were found in the descending  colon and transverse colon. The polyps were 5 mm in                            size. These polyps were removed with a cold biopsy                            forceps. Resection and retrieval were complete.                           Internal hemorrhoids were found during                            retroflexion. The hemorrhoids were small and Grade                            I (internal hemorrhoids that do not prolapse).                           The exam was otherwise without abnormality on                            direct and retroflexion views. Complications:            No immediate complications. Estimated blood loss:                            None. Estimated Blood Loss:     Estimated blood loss: none. Impression:               - One 6 mm polyp in the transverse colon, removed                            with a cold snare. Resected and retrieved.                            - Two 5 mm polyps in the descending colon and in                            the transverse colon, removed with a cold biopsy                            forceps. Resected and retrieved.                           - Internal hemorrhoids.                           - The examination was otherwise normal on direct                            and retroflexion views. Recommendation:           - Repeat colonoscopy in 5 years for surveillance.                           -  Patient has a contact number available for                            emergencies. The signs and symptoms of potential                            delayed complications were discussed with the                            patient. Return to normal activities tomorrow.                            Written discharge instructions were provided to the                            patient.                           - Resume previous diet.                           - Continue present medications.                           - Await pathology results. Ladene Artist, MD 10/30/2018 10:47:18 AM This report has been signed electronically.

## 2018-10-30 NOTE — Progress Notes (Signed)
Called to room to assist during endoscopic procedure.  Patient ID and intended procedure confirmed with present staff. Received instructions for my participation in the procedure from the performing physician.  

## 2018-10-31 ENCOUNTER — Telehealth: Payer: Self-pay

## 2018-10-31 ENCOUNTER — Telehealth: Payer: Self-pay | Admitting: *Deleted

## 2018-10-31 NOTE — Telephone Encounter (Signed)
  Follow up Call-  Call back number 10/30/2018  Post procedure Call Back phone  # [phone]  Permission to leave phone message Yes  Some recent data might be hidden     Patient questions:  Do you have a fever, pain , or abdominal swelling? No. Pain Score  0 *  Have you tolerated food without any problems? Yes.    Have you been able to return to your normal activities? Yes.    Do you have any questions about your discharge instructions: Diet   No. Medications  No. Follow up visit  No.  Do you have questions or concerns about your Care? No.  Actions: * If pain score is 4 or above: No action needed, pain <4.

## 2018-10-31 NOTE — Telephone Encounter (Signed)
Called [phone] and left a messaged we tried to reach pt for a follow up call. maw

## 2018-11-21 ENCOUNTER — Encounter: Payer: Self-pay | Admitting: Gastroenterology

## 2019-01-12 ENCOUNTER — Encounter: Payer: Self-pay | Admitting: Gastroenterology

## 2019-01-12 ENCOUNTER — Ambulatory Visit: Payer: 59 | Admitting: Gastroenterology

## 2019-01-12 VITALS — BP 120/72 | HR 64 | Ht 70.0 in | Wt 235.4 lb

## 2019-01-12 DIAGNOSIS — Z86010 Personal history of colonic polyps: Secondary | ICD-10-CM | POA: Diagnosis not present

## 2019-01-12 DIAGNOSIS — R197 Diarrhea, unspecified: Secondary | ICD-10-CM

## 2019-01-12 DIAGNOSIS — R152 Fecal urgency: Secondary | ICD-10-CM

## 2019-01-12 DIAGNOSIS — Z860101 Personal history of adenomatous and serrated colon polyps: Secondary | ICD-10-CM

## 2019-01-12 MED ORDER — DICYCLOMINE HCL 10 MG PO CAPS: 10.0000 mg | ORAL_CAPSULE | Freq: Three times a day (TID) | ORAL | 11 refills | Status: DC

## 2019-01-12 NOTE — Patient Instructions (Signed)
We have sent the following medications to your pharmacy for you to pick up at your convenience: dicyclomine 10 mg to take one tablet by mouth three times a before meals as needed.   Call our office in 3 weeks if your symptoms are not better.   Thank you for choosing me and [facility].  Pricilla Riffle. Dagoberto Ligas., MD., Marval Regal

## 2019-01-12 NOTE — Progress Notes (Signed)
    History of Present Illness: This is a 63 year old male complaining of diarrhea and urgency.  His symptoms have been present since his teens.  They have not substantially changed over the past several years.  Diarrhea occurs frequently but not daily.  He cannot determine any particular trigger foods except possibly knots which she has avoided.  His symptoms persist.  He has frequent postprandial urgency and urgency at other times but is difficult to manage.  Recent colonoscopy as below.  No other gastrointestinal complaints. Denies weight loss, abdominal pain, constipation, change in stool caliber, melena, hematochezia, nausea, vomiting, dysphagia, reflux symptoms, chest pain.   Colonoscopy 10/2018: - One 6 mm polyp in the transverse colon, removed with a cold snare. Resected and retrieved. - Two 5 mm polyps in the descending colon and in the transverse colon, removed with a cold biopsy forceps. Resected and retrieved. - Internal hemorrhoids. - The examination was otherwise normal on direct and retroflexion views.  Current Medications, Allergies, Past Medical History, Past Surgical History, Family History and Social History were reviewed in [employer] record.  Physical Exam: General: Well developed, well nourished, no acute distress Head: Normocephalic and atraumatic Eyes:  sclerae anicteric, EOMI Ears: Normal auditory acuity Psychological:  Alert and cooperative. Normal mood and affect   Assessment and Recommendations:  1.  Diarrhea and urgency.  Avoid nuts and any other foods that he determines trigger symptoms.  Trial of a low FODMAP diet.  Dicyclomine 10 mg AC 3 times daily.  Imodium 3 times daily as needed.  He is advised to call in 3 weeks if his symptoms are not under good control and we will consider increasing dicyclomine to 20 mg or trying a different antispasmodic.  2.  Personal history of adenomatous colon polyps.  5-year interval surveillance  colonoscopy is recommended in November 2024.  I spent 15 minutes of face-to-face time with the patient. Greater than 50% of the time was spent counseling and coordinating care.

## 2019-02-15 ENCOUNTER — Other Ambulatory Visit: Payer: Self-pay | Admitting: Endocrinology

## 2019-02-15 DIAGNOSIS — D352 Benign neoplasm of pituitary gland: Secondary | ICD-10-CM

## 2019-03-05 ENCOUNTER — Ambulatory Visit
Admission: RE | Admit: 2019-03-05 | Discharge: 2019-03-05 | Disposition: A | Discharge status: Home / Self Care | Payer: 59 | Source: Ambulatory Visit | Attending: Endocrinology | Admitting: Endocrinology

## 2019-03-05 DIAGNOSIS — D352 Benign neoplasm of pituitary gland: Secondary | ICD-10-CM

## 2019-03-05 MED ORDER — GADOBENATE DIMEGLUMINE 529 MG/ML IV SOLN
15.0000 mL | Freq: Once | INTRAVENOUS | Status: AC | PRN
  Administered 2019-03-05: 15 mL via INTRAVENOUS

## 2019-05-03 ENCOUNTER — Other Ambulatory Visit: Payer: Self-pay | Admitting: Family Medicine

## 2019-05-23 ENCOUNTER — Other Ambulatory Visit: Payer: Self-pay | Admitting: Family Medicine

## 2019-05-26 ENCOUNTER — Other Ambulatory Visit: Payer: Self-pay | Admitting: Family Medicine

## 2019-06-19 ENCOUNTER — Other Ambulatory Visit: Payer: Self-pay | Admitting: Family Medicine

## 2019-07-23 ENCOUNTER — Telehealth: Payer: Self-pay | Admitting: Family Medicine

## 2019-07-23 MED ORDER — ATORVASTATIN CALCIUM 40 MG PO TABS: 40.0000 mg | ORAL_TABLET | Freq: Every day | ORAL | 0 refills | Status: DC

## 2019-07-23 MED ORDER — SERTRALINE HCL 100 MG PO TABS: 100.0000 mg | ORAL_TABLET | Freq: Every day | ORAL | 0 refills | Status: DC

## 2019-07-23 NOTE — Telephone Encounter (Signed)
Rx has been sent in. Patient is aware. 

## 2019-07-23 NOTE — Telephone Encounter (Signed)
Copied from [facility] [id number] [id number]. Topic: Quick Communication - Rx Refill/Question >> Jul 23, 2019 10:07 AM Mcneil, Ja-Kwan wrote: Medication: atorvastatin (LIPITOR) 40 MG tablet  and  sertraline (ZOLOFT) 100 MG tablet  Has the patient contacted their pharmacy? no  Preferred Pharmacy (with phone number or street name): [facility] [phone] (Phone)  [phone] (Fax)  Agent: Please be advised that RX refills may take up to 3 business days. We ask that you follow-up with your pharmacy.

## 2019-08-01 ENCOUNTER — Other Ambulatory Visit: Payer: Self-pay

## 2019-08-01 ENCOUNTER — Encounter: Payer: Self-pay | Admitting: Family Medicine

## 2019-08-01 ENCOUNTER — Ambulatory Visit (INDEPENDENT_AMBULATORY_CARE_PROVIDER_SITE_OTHER): Payer: 59 | Admitting: Family Medicine

## 2019-08-01 VITALS — BP 130/62 | HR 66 | Temp 98.5°F | Wt 233.8 lb

## 2019-08-01 DIAGNOSIS — Z0000 Encounter for general adult medical examination without abnormal findings: Secondary | ICD-10-CM | POA: Diagnosis not present

## 2019-08-01 MED ORDER — SERTRALINE HCL 100 MG PO TABS: 100.0000 mg | ORAL_TABLET | Freq: Every day | ORAL | 3 refills | Status: DC

## 2019-08-01 MED ORDER — ATORVASTATIN CALCIUM 40 MG PO TABS: 40.0000 mg | ORAL_TABLET | Freq: Every day | ORAL | 3 refills | Status: DC

## 2019-08-01 NOTE — Progress Notes (Signed)
   Subjective:    Patient ID: Nicholas Moore, male    DOB: Mar 28, 1956, 63 y.o.   MRN: [id number]  HPI Here for a well exam. He feels fine. He sees Dr. Vernie Shanks for urologic exams.    Review of Systems  Constitutional: Negative.   HENT: Negative.   Eyes: Negative.   Respiratory: Negative.   Cardiovascular: Negative.   Gastrointestinal: Negative.   Genitourinary: Negative.   Musculoskeletal: Negative.   Skin: Negative.   Neurological: Negative.   Psychiatric/Behavioral: Negative.        Objective:   Physical Exam Constitutional:      General: He is not in acute distress.    Appearance: He is well-developed. He is not diaphoretic.  HENT:     Head: Normocephalic and atraumatic.     Right Ear: External ear normal.     Left Ear: External ear normal.     Nose: Nose normal.     Mouth/Throat:     Pharynx: No oropharyngeal exudate.  Eyes:     General: No scleral icterus.       Right eye: No discharge.        Left eye: No discharge.     Conjunctiva/sclera: Conjunctivae normal.     Pupils: Pupils are equal, round, and reactive to light.  Neck:     Musculoskeletal: Neck supple.     Thyroid: No thyromegaly.     Vascular: No JVD.     Trachea: No tracheal deviation.  Cardiovascular:     Rate and Rhythm: Normal rate and regular rhythm.     Heart sounds: Normal heart sounds. No murmur. No friction rub. No gallop.   Pulmonary:     Effort: Pulmonary effort is normal. No respiratory distress.     Breath sounds: Normal breath sounds. No wheezing or rales.  Chest:     Chest wall: No tenderness.  Abdominal:     General: Bowel sounds are normal. There is no distension.     Palpations: Abdomen is soft. There is no mass.     Tenderness: There is no abdominal tenderness. There is no guarding or rebound.  Genitourinary:    Penis: No tenderness.   Musculoskeletal: Normal range of motion.        General: No tenderness.  Lymphadenopathy:     Cervical: No cervical adenopathy.  Skin:  General: Skin is warm and dry.     Coloration: Skin is not pale.     Findings: No erythema or rash.  Neurological:     Mental Status: He is alert and oriented to person, place, and time.     Cranial Nerves: No cranial nerve deficit.     Motor: No abnormal muscle tone.     Coordination: Coordination normal.     Deep Tendon Reflexes: Reflexes are normal and symmetric. Reflexes normal.  Psychiatric:        Behavior: Behavior normal.        Thought Content: Thought content normal.        Judgment: Judgment normal.           Assessment & Plan:  Well exam. We discussed diet and exercise. Set up fasting labs soon.  Alysia Penna, MD

## 2019-08-08 ENCOUNTER — Other Ambulatory Visit: Payer: Self-pay

## 2019-08-08 ENCOUNTER — Other Ambulatory Visit (INDEPENDENT_AMBULATORY_CARE_PROVIDER_SITE_OTHER): Payer: 59

## 2019-08-08 DIAGNOSIS — Z0000 Encounter for general adult medical examination without abnormal findings: Secondary | ICD-10-CM | POA: Diagnosis not present

## 2019-08-08 LAB — LIPID PANEL
Cholesterol: 163 mg/dL (ref 0–200)
HDL: 46.8 mg/dL (ref >39.00)
LDL Cholesterol: 98 mg/dL (ref 0–99)
NonHDL: 115.74
Total CHOL/HDL Ratio: 3
Triglycerides: 87 mg/dL (ref 0.0–149.0)
VLDL: 17.4 mg/dL (ref 0.0–40.0)

## 2019-08-08 LAB — POC URINALSYSI DIPSTICK (AUTOMATED)
Bilirubin, UA: NEGATIVE
Blood, UA: NEGATIVE
Glucose, UA: NEGATIVE
Ketones, UA: NEGATIVE
Leukocytes, UA: NEGATIVE
Nitrite, UA: NEGATIVE
Protein, UA: NEGATIVE
Spec Grav, UA: 1.01
Urobilinogen, UA: 0.2 U/dL
pH, UA: 6

## 2019-08-08 LAB — HEPATIC FUNCTION PANEL
ALT: 21 U/L (ref 0–53)
AST: 19 U/L (ref 0–37)
Albumin: 4.3 g/dL (ref 3.5–5.2)
Alkaline Phosphatase: 58 U/L (ref 39–117)
Bilirubin, Direct: 0.1 mg/dL (ref 0.0–0.3)
Total Bilirubin: 0.8 mg/dL (ref 0.2–1.2)
Total Protein: 6.6 g/dL (ref 6.0–8.3)

## 2019-08-08 LAB — CBC WITH DIFFERENTIAL/PLATELET
Basophils Absolute: 0.1 10*3/uL (ref 0.0–0.1)
Basophils Relative: 0.9 % (ref 0.0–3.0)
Eosinophils Absolute: 0.2 10*3/uL (ref 0.0–0.7)
Eosinophils Relative: 2.3 % (ref 0.0–5.0)
HCT: 46.9 % (ref 39.0–52.0)
Hemoglobin: 16.1 g/dL (ref 13.0–17.0)
Lymphocytes Relative: 24.5 % (ref 12.0–46.0)
Lymphs Abs: 1.6 10*3/uL (ref 0.7–4.0)
MCHC: 34.4 g/dL (ref 30.0–36.0)
MCV: 91.2 fl (ref 78.0–100.0)
Monocytes Absolute: 0.6 10*3/uL (ref 0.1–1.0)
Monocytes Relative: 9.1 % (ref 3.0–12.0)
Neutro Abs: 4.2 10*3/uL (ref 1.4–7.7)
Neutrophils Relative %: 63.2 % (ref 43.0–77.0)
Platelets: 243 10*3/uL (ref 150.0–400.0)
RBC: 5.14 Mil/uL (ref 4.22–5.81)
RDW: 13.2 % (ref 11.5–15.5)
WBC: 6.7 10*3/uL (ref 4.0–10.5)

## 2019-08-08 LAB — BASIC METABOLIC PANEL WITH GFR
BUN: 18 mg/dL (ref 6–23)
CO2: 28 meq/L (ref 19–32)
Calcium: 9.5 mg/dL (ref 8.4–10.5)
Chloride: 103 meq/L (ref 96–112)
Creatinine, Ser: 0.91 mg/dL (ref 0.40–1.50)
GFR: 84.23 mL/min
Glucose, Bld: 98 mg/dL (ref 70–99)
Potassium: 4.5 meq/L (ref 3.5–5.1)
Sodium: 138 meq/L (ref 135–145)

## 2019-08-08 LAB — TSH: TSH: 3.03 u[IU]/mL (ref 0.35–4.50)

## 2019-10-18 ENCOUNTER — Other Ambulatory Visit: Payer: Self-pay

## 2019-10-18 DIAGNOSIS — Z20822 Contact with and (suspected) exposure to covid-19: Secondary | ICD-10-CM

## 2019-10-20 LAB — NOVEL CORONAVIRUS, NAA: SARS-CoV-2, NAA: DETECTED — AB

## 2019-10-21 ENCOUNTER — Encounter: Payer: Self-pay | Admitting: Family Medicine

## 2019-10-22 ENCOUNTER — Telehealth: Payer: Self-pay | Admitting: *Deleted

## 2019-10-22 NOTE — Telephone Encounter (Signed)
-----   Message from Laurey Morale, MD sent at 10/22/2019  4:33 PM EDT ----- Please notify the HD  ----- Message ----- From: Darreld Mclean, MD Sent: 10/21/2019  11:43 AM EDT To: Laurey Morale, MD  Hi Dr Sarajane Jews Can you please have your staff notify the HD about her positive covid results on Monday?  Thank you JC

## 2019-10-22 NOTE — Telephone Encounter (Signed)
Patient is aware 

## 2019-12-31 ENCOUNTER — Encounter: Payer: Self-pay | Admitting: Family Medicine

## 2019-12-31 ENCOUNTER — Other Ambulatory Visit: Payer: Self-pay

## 2019-12-31 ENCOUNTER — Ambulatory Visit: Payer: 59 | Admitting: Family Medicine

## 2019-12-31 ENCOUNTER — Ambulatory Visit (INDEPENDENT_AMBULATORY_CARE_PROVIDER_SITE_OTHER): Payer: 59

## 2019-12-31 VITALS — BP 120/70 | HR 69 | Temp 98.0°F | Wt 238.8 lb

## 2019-12-31 DIAGNOSIS — R0781 Pleurodynia: Secondary | ICD-10-CM | POA: Diagnosis not present

## 2019-12-31 NOTE — Progress Notes (Signed)
   Subjective:    Patient ID: Nicholas Moore, male    DOB: [birth year], 64 y.o.   MRN: [id number]  HPI Here for pain in the right ribs after he slipped in sock feet down 4 steps at home 8 weeks ago. He had a sharp pain that has slowly been improving, but it still bothers him. No SOB. No trouble sleeping. He is not treating this with anything.    Review of Systems  Constitutional: Negative.   Respiratory: Negative.   Cardiovascular: Positive for chest pain. Negative for palpitations and leg swelling.       Objective:   Physical Exam Constitutional:      General: He is not in acute distress.    Appearance: Normal appearance.  Cardiovascular:     Rate and Rhythm: Normal rate and regular rhythm.     Pulses: Normal pulses.     Heart sounds: Normal heart sounds.  Pulmonary:     Effort: Pulmonary effort is normal.     Breath sounds: Normal breath sounds.     Comments: Mildly tender along the inferior margin of the right lateral ribs. No crepitus.  Neurological:     Mental Status: He is alert.           Assessment & Plan:  Rib contusion. This should heal up on its own in the next few weeks. Get Xrays today.  Alysia Penna, MD

## 2020-08-09 ENCOUNTER — Other Ambulatory Visit: Payer: Self-pay | Admitting: Family Medicine

## 2020-10-13 LAB — TESTOSTERONE: Testosterone: 426.4

## 2020-10-13 LAB — PSA: PSA: 0.2

## 2020-10-13 LAB — CBC AND DIFFERENTIAL
HCT: 44 (ref 41–53)
Hemoglobin: 15.3 (ref 13.5–17.5)

## 2020-10-23 ENCOUNTER — Other Ambulatory Visit: Payer: Self-pay

## 2020-10-31 ENCOUNTER — Encounter: Payer: Self-pay | Admitting: Pulmonary Disease

## 2020-10-31 ENCOUNTER — Other Ambulatory Visit: Payer: Self-pay

## 2020-10-31 ENCOUNTER — Ambulatory Visit (INDEPENDENT_AMBULATORY_CARE_PROVIDER_SITE_OTHER): Payer: 59 | Admitting: Pulmonary Disease

## 2020-10-31 VITALS — BP 130/70 | HR 64 | Temp 97.8°F | Ht 70.0 in | Wt 233.8 lb

## 2020-10-31 DIAGNOSIS — J309 Allergic rhinitis, unspecified: Secondary | ICD-10-CM

## 2020-10-31 DIAGNOSIS — G4733 Obstructive sleep apnea (adult) (pediatric): Secondary | ICD-10-CM | POA: Diagnosis not present

## 2020-10-31 MED ORDER — MOMETASONE FUROATE 50 MCG/ACT NA SUSP: 1.0000 | Freq: Every evening | NASAL | 11 refills | Status: DC

## 2020-10-31 NOTE — Assessment & Plan Note (Signed)
Continue Nasonex 1 spray each nare at bedtime

## 2020-10-31 NOTE — Progress Notes (Signed)
Subjective:    Patient ID: Nicholas Moore, male    DOB: 08-16-1956, 64 y.o.   MRN: [id number]  HPI   Chief Complaint  Patient presents with  . 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 He has been seen by my partners Dr. Gwenette Greet  and Dr. Tennis Must dios in the past He was originally diagnosed in 2004 with severe OSA corrected by CPAP of 12 cm. He then underwent UPPP by Dr. Wilburn Cornelia and had a repeat study in 2007 which still showed residual OSA at 15/hour of moderate degree Around 2012, he obtained a Fisher-[employer]kel auto CPAP 12 to 16 cm with a nasal mask.  This worked well for him and helped improve his daytime somnolence and fatigue. He has had persistent nasal congestion since then. He was last seen in 2017. Epworth sleepiness score is 8 Bedtime is around 11 PM , sleep latency is minimal, he sleeps on his left side with one pillow, he has an older dog and causes him to have 3-4 nocturnal awakenings, many at times he will sleep in a different room from his wife to avoid waking her up when he wakes up for his dog, and is out of bed at 7 AM feeling tired with dryness of mouth, no headaches.  Loud snoring has been noted by his wife. He has gained about 15 pounds since his prior study  He denies excessive caffeinated beverages, drinks 3 glasses of wine every night until bedtime   Significant tests/ events reviewed 04/2003 NPSG AHI 68/hour, corrected by 12 cm. 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 Allergy   . Anxiety   . ED (erectile dysfunction)   . GERD (gastroesophageal reflux disease)    diet controlled  . History of hypotestosteronemia    sees Dr. Preston Fleeting  . Hyperlipidemia   . Hypertension    no meds  .  Melanoma ([facility])    dr Allyson Sabal - left upper back  . 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 Roque Cash   . Sleep apnea    sees Dr. Danton Sewer, wears CPAP nightly  . Tubular adenoma of colon 05/2002   Past Surgical History:  Procedure Laterality Date  . COLONOSCOPY  10/30/2018   per Dr. Fuller Plan, adenomatous polyp, repeat in 5 yrs   . KNEE ARTHROSCOPY Left   . SEPTOPLASTY  2007  . SKIN LESION EXCISION     sees Dr. Nena Polio  - left back  . TONSILLECTOMY  2007  . WISDOM TOOTH EXTRACTION      No Known Allergies  Social History   Socioeconomic History  . Marital status: Married    Spouse name: Not on file  . Number of children: Not on file  . Years of education: Not on file  . Highest education level: Not on file  Occupational History  . Not on file  Tobacco Use  . Smoking status: Never Smoker  . Smokeless tobacco: Never Used  Vaping Use  . Vaping Use: Never used  Substance and Sexual Activity  . Alcohol use: Yes    Alcohol/week: 14.0 standard drinks    Types: 14 Glasses of wine per week  .  Drug use: No  . Sexual activity: Yes  Other Topics Concern  . Not on file  Social History Narrative  . Not on file   Social Determinants of Health   Financial Resource Strain:   . Difficulty of Paying Living Expenses: Not on file  Food Insecurity:   . Worried About Charity fundraiser in the Last Year: Not on file  . Ran Out of Food in the Last Year: Not on file  Transportation Needs:   . Lack of Transportation (Medical): Not on file  . Lack of Transportation (Non-Medical): Not on file  Physical Activity:   . Days of Exercise per Week: Not on file  . Minutes of Exercise per Session: Not on file  Stress:   . Feeling of Stress : Not on file  Social Connections:   . Frequency of Communication with Friends and Family: Not on file  . Frequency of Social Gatherings with Friends and Family: Not on file  . Attends Religious Services: Not on file  .  Active Member of Clubs or Organizations: Not on file  . Attends Archivist Meetings: Not on file  . Marital Status: Not on file  Intimate Partner Violence:   . Fear of Current or Ex-Partner: Not on file  . Emotionally Abused: Not on file  . Physically Abused: Not on file  . Sexually Abused: Not on file    Family History  Problem Relation Age of Onset  . Alcohol abuse Other   . Coronary artery disease Other   . Hypertension Other   . Emphysema Other   . Colon cancer Neg Hx   . Pancreatic cancer Neg Hx   . Stomach cancer Neg Hx   . Rectal cancer Neg Hx       Review of Systems Constitutional: negative for anorexia, fevers and sweats  Eyes: negative for irritation, redness and visual disturbance  Ears, nose, mouth, throat, and face: negative for earaches, epistaxis, nasal congestion and sore throat  Respiratory: negative for cough, dyspnea on exertion, sputum and wheezing  Cardiovascular: negative for chest pain, dyspnea, lower extremity edema, orthopnea, palpitations and syncope  Gastrointestinal: negative for abdominal pain, constipation, diarrhea, melena, nausea and vomiting  Genitourinary:negative for dysuria, frequency and hematuria  Hematologic/lymphatic: negative for bleeding, easy bruising and lymphadenopathy  Musculoskeletal:negative for arthralgias, muscle weakness and stiff joints  Neurological: negative for coordination problems, gait problems, headaches and weakness  Endocrine: negative for diabetic symptoms including polydipsia, polyuria and weight loss     Objective:   Physical Exam  Gen. Pleasant, obese, in no distress, normal affect ENT - no pallor,icterus, no post nasal drip, class 2-3 airway Neck: No JVD, no thyromegaly, no carotid bruits Lungs: no use of accessory muscles, no dullness to percussion, decreased without rales or rhonchi  Cardiovascular: Rhythm regular, heart sounds  normal, no murmurs or gallops, no peripheral edema Abdomen: soft  and non-tender, no hepatosplenomegaly, BS normal. Musculoskeletal: No deformities, no cyanosis or clubbing Neuro:  alert, non focal, no tremors        Assessment & Plan:

## 2020-10-31 NOTE — Patient Instructions (Signed)
  Trial of AirFit N 30 nasal mask. Use Nasonex 1 spray each nare at bedtime for nasal congestion.  Schedule home sleep test. Based on this, we will decide the way forward -dental appliance versus CPAP for long-term.  Minimal discussed with your dentist if he can make an oral appliance which will target both bruxism and OSA

## 2020-10-31 NOTE — Assessment & Plan Note (Signed)
Unable to obtain download today on Ms. Fisher and [employer]el auto CPAP.  Seemingly is set at auto settings 12 to 16 cm with average pressure of 14 cm based on prior download  Schedule home sleep test to reassess severity, initial study in 2004 showed severe OSA but repeat study in 2007 after UPPP only showed moderate disease Based on this, we will decide the way forward -dental appliance versus CPAP for long-term.  If Minimal, can  discuss with your dentist if he can make an oral appliance which will target both bruxism and OSA  Trial of AirFit N 30 nasal mask. Use Nasonex 1 spray each nare at bedtime for nasal congestion.

## 2020-11-01 ENCOUNTER — Other Ambulatory Visit: Payer: Self-pay | Admitting: Family Medicine

## 2020-11-06 ENCOUNTER — Telehealth: Payer: 59 | Admitting: Family Medicine

## 2020-11-06 DIAGNOSIS — R0981 Nasal congestion: Secondary | ICD-10-CM | POA: Diagnosis not present

## 2020-11-06 DIAGNOSIS — R059 Cough, unspecified: Secondary | ICD-10-CM

## 2020-11-06 MED ORDER — BENZONATATE 100 MG PO CAPS: 100.0000 mg | ORAL_CAPSULE | Freq: Three times a day (TID) | ORAL | 0 refills | Status: DC | PRN

## 2020-11-06 MED ORDER — AMOXICILLIN-POT CLAVULANATE 875-125 MG PO TABS: 1.0000 | ORAL_TABLET | Freq: Two times a day (BID) | ORAL | 0 refills | Status: DC

## 2020-11-06 NOTE — Progress Notes (Signed)
Virtual Visit via Video Note  I connected with Mark  on 11/06/20 at 11:00 AM EST by a video enabled telemedicine application and verified that I am speaking with the correct person using two identifiers.  Location patient: home, [place] Location provider:work or home office Persons participating in the virtual visit: patient, provider  I discussed the limitations of evaluation and management by telemedicine and the availability of in person appointments. The patient expressed understanding and agreed to proceed.   HPI:  Acute telemedicine visit for cough and congestion: -Onset: chronic this fall with allergies, but worse the last 4-5 days -Symptoms include: thick yellow nasal congestion, PND, cough, itchy eyes, sore throat, mild SOB at times -had negative covid test -Took a few doses of azithromycin- wife's doc sent it in for him, but it is not helping -Denies: fevers, SOB, CP, diarrhea, vomiting, severe headaches, inability to eat/drink or get out of bed -Pertinent past medical history:hx of sinusitis, sinus surgery and tonsil surgery -Pertinent medication allergies: nkda -COVID-19 vaccine status: fully vaccinated + booster and had COVID19 in the past  ROS: See pertinent positives and negatives per HPI.  Past Medical History:  Diagnosis Date  . Allergy   . Anxiety   . ED (erectile dysfunction)   . GERD (gastroesophageal reflux disease)    diet controlled  . History of hypotestosteronemia    sees Dr. Preston Fleeting  . Hyperlipidemia   . Hypertension    no meds  . Melanoma ([place])    dr Allyson Sabal - left upper back  . Pituitary adenoma with extrasellar extension [facility])    sees Dr. Roque Cash   . Sleep apnea    sees Dr. Danton Sewer, wears CPAP nightly  . Tubular adenoma of colon 05/2002    Past Surgical History:  Procedure Laterality Date  . COLONOSCOPY  10/30/2018   per Dr. Fuller Plan, adenomatous polyp, repeat in 5 yrs   . KNEE ARTHROSCOPY Left   . SEPTOPLASTY  2007  . SKIN  LESION EXCISION     sees Dr. Nena Polio  - left back  . TONSILLECTOMY  2007  . WISDOM TOOTH EXTRACTION       Current Outpatient Medications:  .  amoxicillin-clavulanate (AUGMENTIN) 875-125 MG tablet, Take 1 tablet by mouth 2 (two) times daily., Disp: 20 tablet, Rfl: 0 .  atorvastatin (LIPITOR) 40 MG tablet, TAKE 1 TABLET (40 MG TOTAL) BY MOUTH DAILY. PATIENT NEEDS A OV FOR FURTHER REFILLS., Disp: 30 tablet, Rfl: 0 .  benzonatate (TESSALON PERLES) 100 MG capsule, Take 1 capsule (100 mg total) by mouth 3 (three) times daily as needed., Disp: 20 capsule, Rfl: 0 .  cabergoline (DOSTINEX) 0.5 MG tablet, Take 0.5 mg by mouth 2 (two) times a week., Disp: , Rfl:  .  mometasone (NASONEX) 50 MCG/ACT nasal spray, Place 1 spray into the nose at bedtime., Disp: 1 each, Rfl: 11 .  sertraline (ZOLOFT) 100 MG tablet, TAKE 1 TABLET (100 MG TOTAL) BY MOUTH DAILY. PATIENT NEEDS AN OV FOR FURTHER REFILLS., Disp: 30 tablet, Rfl: 0 .  Testosterone 30 MG/ACT SOLN, Place onto the skin., Disp: , Rfl:   EXAM:  VITALS per patient if applicable:  GENERAL: alert, oriented, appears well and in no acute distress  HEENT: atraumatic, conjunttiva clear, no obvious abnormalities on inspection of external nose and ears  NECK: normal movements of the head and neck  LUNGS: on inspection no signs of respiratory distress, breathing rate appears normal, no obvious gross SOB, gasping or wheezing  CV: no obvious cyanosis  MS: moves all visible extremities without noticeable abnormality  PSYCH/NEURO: pleasant and cooperative, no obvious depression or anxiety, speech and thought processing grossly intact  ASSESSMENT AND PLAN:  Discussed the following assessment and plan:  Nasal congestion  Cough  -we discussed possible serious and likely etiologies, options for evaluation and workup, limitations of telemedicine visit vs in person visit, treatment, treatment risks and precautions. Pt prefers to treat via telemedicine  empirically rather than in person at this moment.  Given the duration of symptoms with worsening query sinusitis versus other.  He opted for treatment with Augmentin 875 twice daily for 7 to 10 days.  We also sent Tessalon for cough.  Discussed possibility of other acute viral illnesses.  He is fully vaccinated plus booster for Covid so do feel that is less likely.  Advised low threshold to get checked out in person if worsening or not improving with treatment. Work/School slipped offered:declined Scheduled follow up with PCP offered: Agrees to follow-up if needed Advised to seek prompt in person care if worsening, new symptoms arise, or if is not improving with treatment. Discussed options for inperson care if PCP office not available. Did let this patient know that I only do telemedicine on Tuesdays and Thursdays for [place]. Advised to schedule follow up visit with PCP or [facility] if any further questions or concerns to avoid delays in care.   I discussed the assessment and treatment plan with the patient. The patient was provided an opportunity to ask questions and all were answered. The patient agreed with the plan and demonstrated an understanding of the instructions.     Lucretia Kern, DO

## 2020-11-06 NOTE — Patient Instructions (Signed)
-  I sent the medication(s) we discussed to your pharmacy: Meds ordered this encounter  Medications   amoxicillin-clavulanate (AUGMENTIN) 875-125 MG tablet    Sig: Take 1 tablet by mouth 2 (two) times daily.    Dispense:  20 tablet    Refill:  0   benzonatate (TESSALON PERLES) 100 MG capsule    Sig: Take 1 capsule (100 mg total) by mouth 3 (three) times daily as needed.    Dispense:  20 capsule    Refill:  0     I hope you are feeling better soon!  Seek in person care promptly if your symptoms worsen, new concerns arise or you are not improving with treatment.  It was nice to meet you today. I help [place] out with telemedicine visits on Tuesdays and Thursdays and am available for visits on those days. If you have any concerns or questions following this visit please schedule a follow up visit with your Primary Care doctor or seek care at a local urgent care clinic to avoid delays in care.   

## 2020-12-01 ENCOUNTER — Ambulatory Visit: Payer: 59

## 2020-12-01 ENCOUNTER — Other Ambulatory Visit: Payer: Self-pay

## 2020-12-01 DIAGNOSIS — J309 Allergic rhinitis, unspecified: Secondary | ICD-10-CM

## 2020-12-01 DIAGNOSIS — G4733 Obstructive sleep apnea (adult) (pediatric): Secondary | ICD-10-CM | POA: Diagnosis not present

## 2020-12-02 DIAGNOSIS — G4733 Obstructive sleep apnea (adult) (pediatric): Secondary | ICD-10-CM | POA: Diagnosis not present

## 2020-12-03 ENCOUNTER — Telehealth: Payer: Self-pay | Admitting: Pulmonary Disease

## 2020-12-03 DIAGNOSIS — G4733 Obstructive sleep apnea (adult) (pediatric): Secondary | ICD-10-CM

## 2020-12-03 NOTE — Telephone Encounter (Signed)
Called and left message on voicemail to please return phone call to go over HST results. Contact number provided.  

## 2020-12-03 NOTE — Telephone Encounter (Signed)
HST showed severe OSA with AHI 30/ hr He needs to continue on CPAP. Oral appliance will not be as effective since high severity He would be eligible for a new CPAP, if interested Please order autoCPAP 12-16 cm  OV with me /APP in 8-10 weeks

## 2020-12-04 NOTE — Addendum Note (Signed)
Addended by: Merrilee Seashore on: 12/04/2020 11:12 AM   Modules accepted: Orders

## 2020-12-04 NOTE — Telephone Encounter (Signed)
Patient returned phone call. Writer went over [employer] results per Dr Elsworth Soho with patient. All questions answered and patient expressed full understanding. Patient agreeable to Dr Bari Mantis recommendations and would like a new CPAP. CPAP orders placed per Dr Elsworth Soho (autoCPAP 12-16 cm.). Scheduled office visit with NP for Monday 02/09/2021 at [place] at the [place] office. Patient agreeable to time, date and location. Nothing further needed at this time.

## 2021-01-12 ENCOUNTER — Telehealth: Payer: Self-pay | Admitting: Pulmonary Disease

## 2021-01-12 NOTE — Telephone Encounter (Signed)
Received a PA for Nasonex. PA was started on [email or web address]. Key is [id number]. PA was instantly approved 12/27/20-01/12/22.    Called pharmacy to let them know of the approval but no one answered. Will try again tomorrow from the office.

## 2021-02-03 ENCOUNTER — Telehealth: Payer: Self-pay | Admitting: Pulmonary Disease

## 2021-02-03 NOTE — Telephone Encounter (Addendum)
I called pt to see if ins didn't have order or if it was [employer] that didn't have his order.  He was very frustrated and said he really didn't know.  His ins  info changed after 12/27/20.  He still has [employer] but different id # and grp #.  He said he tried to give info to [place] and he tried to give it to [employer].  He was confused as to where the issue is.  He said he understood having to wait for machine but everyone is trying to throw responsibility on him.  I called [employer] & spoke to [employer].  Order had been voided per pt's request a couple of weeks ago.  Leroy Sea is going to pull it back into active status and send e-mail giving correct ins info.  He could see where there had been issues with insurance.  He is going to try to get it straightened out as best he can and have someone to call pt today and let him know what's taking place.  Nothing further needed.

## 2021-02-09 ENCOUNTER — Ambulatory Visit: Payer: 59 | Admitting: Adult Health

## 2021-06-25 ENCOUNTER — Other Ambulatory Visit: Payer: Self-pay

## 2021-06-25 ENCOUNTER — Telehealth: Payer: Self-pay | Admitting: Family Medicine

## 2021-06-25 MED ORDER — ATORVASTATIN CALCIUM 40 MG PO TABS: 40.0000 mg | ORAL_TABLET | Freq: Every day | ORAL | 0 refills | Status: DC

## 2021-06-25 MED ORDER — SERTRALINE HCL 100 MG PO TABS: 100.0000 mg | ORAL_TABLET | Freq: Every day | ORAL | 0 refills | Status: DC

## 2021-06-25 NOTE — Telephone Encounter (Signed)
Patient states that he would like his prescriptions sent to [facility], [place], [place] [address].  He says that he has two prescriptions that need refills and would like them sent to that pharmacy instead.  Patient would like a call at [phone] once completed.  Please advise.

## 2021-06-25 NOTE — Telephone Encounter (Addendum)
Spoke with patient. Last lipid labs-08/08/19.  CPE scheduled for 07/03/21 @930am .   Patient aware to fast for pending labs. Refills for Sertraline 100mg , and Atorvastatin 40mg  sent to [facility].

## 2021-07-02 ENCOUNTER — Other Ambulatory Visit: Payer: Self-pay

## 2021-07-03 ENCOUNTER — Encounter: Payer: Self-pay | Admitting: Family Medicine

## 2021-07-03 ENCOUNTER — Ambulatory Visit (INDEPENDENT_AMBULATORY_CARE_PROVIDER_SITE_OTHER): Payer: Managed Care, Other (non HMO) | Admitting: Family Medicine

## 2021-07-03 VITALS — BP 118/76 | HR 57 | Temp 97.7°F | Ht 70.0 in | Wt 222.0 lb

## 2021-07-03 DIAGNOSIS — Z0000 Encounter for general adult medical examination without abnormal findings: Secondary | ICD-10-CM | POA: Diagnosis not present

## 2021-07-03 LAB — HEPATIC FUNCTION PANEL
ALT: 20 U/L (ref 0–53)
AST: 19 U/L (ref 0–37)
Albumin: 4.2 g/dL (ref 3.5–5.2)
Alkaline Phosphatase: 58 U/L (ref 39–117)
Bilirubin, Direct: 0.2 mg/dL (ref 0.0–0.3)
Total Bilirubin: 0.8 mg/dL (ref 0.2–1.2)
Total Protein: 6.7 g/dL (ref 6.0–8.3)

## 2021-07-03 LAB — CBC WITH DIFFERENTIAL/PLATELET
Basophils Absolute: 0.1 10*3/uL (ref 0.0–0.1)
Basophils Relative: 1.3 % (ref 0.0–3.0)
Eosinophils Absolute: 0.2 10*3/uL (ref 0.0–0.7)
Eosinophils Relative: 2.9 % (ref 0.0–5.0)
HCT: 45.5 % (ref 39.0–52.0)
Hemoglobin: 15.5 g/dL (ref 13.0–17.0)
Lymphocytes Relative: 28.5 % (ref 12.0–46.0)
Lymphs Abs: 1.7 10*3/uL (ref 0.7–4.0)
MCHC: 34 g/dL (ref 30.0–36.0)
MCV: 90.5 fl (ref 78.0–100.0)
Monocytes Absolute: 0.5 10*3/uL (ref 0.1–1.0)
Monocytes Relative: 8.6 % (ref 3.0–12.0)
Neutro Abs: 3.5 10*3/uL (ref 1.4–7.7)
Neutrophils Relative %: 58.7 % (ref 43.0–77.0)
Platelets: 222 10*3/uL (ref 150.0–400.0)
RBC: 5.03 Mil/uL (ref 4.22–5.81)
RDW: 13.6 % (ref 11.5–15.5)
WBC: 5.9 10*3/uL (ref 4.0–10.5)

## 2021-07-03 LAB — BASIC METABOLIC PANEL
BUN: 23 mg/dL (ref 6–23)
CO2: 26 mEq/L (ref 19–32)
Calcium: 9.2 mg/dL (ref 8.4–10.5)
Chloride: 104 mEq/L (ref 96–112)
Creatinine, Ser: 0.89 mg/dL (ref 0.40–1.50)
GFR: 90.52 mL/min (ref >60.00)
Glucose, Bld: 86 mg/dL (ref 70–99)
Potassium: 4.6 mEq/L (ref 3.5–5.1)
Sodium: 138 mEq/L (ref 135–145)

## 2021-07-03 LAB — T3, FREE: T3, Free: 3.6 pg/mL (ref 2.3–4.2)

## 2021-07-03 LAB — LIPID PANEL
Cholesterol: 165 mg/dL (ref 0–200)
HDL: 54.8 mg/dL (ref >39.00)
LDL Cholesterol: 96 mg/dL (ref 0–99)
NonHDL: 109.79
Total CHOL/HDL Ratio: 3
Triglycerides: 67 mg/dL (ref 0.0–149.0)
VLDL: 13.4 mg/dL (ref 0.0–40.0)

## 2021-07-03 LAB — HEMOGLOBIN A1C: Hgb A1c MFr Bld: 5.7 % (ref 4.6–6.5)

## 2021-07-03 LAB — TSH: TSH: 1.72 u[IU]/mL (ref 0.35–5.50)

## 2021-07-03 LAB — T4, FREE: Free T4: 0.76 ng/dL (ref 0.60–1.60)

## 2021-07-03 MED ORDER — ATORVASTATIN CALCIUM 40 MG PO TABS: 40.0000 mg | ORAL_TABLET | Freq: Every day | ORAL | 3 refills | Status: DC

## 2021-07-03 MED ORDER — SERTRALINE HCL 100 MG PO TABS: 100.0000 mg | ORAL_TABLET | Freq: Every day | ORAL | 3 refills | Status: DC

## 2021-07-03 NOTE — Progress Notes (Signed)
   Subjective:    Patient ID: Nicholas Moore, male    DOB: 1956/09/06, 65 y.o.   MRN: [id number]  HPI Here for a well exam. He feels fine.    Review of Systems  Constitutional: Negative.   HENT: Negative.    Eyes: Negative.   Respiratory: Negative.    Cardiovascular: Negative.   Gastrointestinal: Negative.   Genitourinary: Negative.   Musculoskeletal: Negative.   Skin: Negative.   Neurological: Negative.   Psychiatric/Behavioral: Negative.        Objective:   Physical Exam Constitutional:      General: He is not in acute distress.    Appearance: Normal appearance. He is well-developed. He is not diaphoretic.  HENT:     Head: Normocephalic and atraumatic.     Right Ear: External ear normal.     Left Ear: External ear normal.     Nose: Nose normal.     Mouth/Throat:     Pharynx: No oropharyngeal exudate.  Eyes:     General: No scleral icterus.       Right eye: No discharge.        Left eye: No discharge.     Conjunctiva/sclera: Conjunctivae normal.     Pupils: Pupils are equal, round, and reactive to light.  Neck:     Thyroid: No thyromegaly.     Vascular: No JVD.     Trachea: No tracheal deviation.  Cardiovascular:     Rate and Rhythm: Normal rate and regular rhythm.     Heart sounds: Normal heart sounds. No murmur heard.   No friction rub. No gallop.  Pulmonary:     Effort: Pulmonary effort is normal. No respiratory distress.     Breath sounds: Normal breath sounds. No wheezing or rales.  Chest:     Chest wall: No tenderness.  Abdominal:     General: Bowel sounds are normal. There is no distension.     Palpations: Abdomen is soft. There is no mass.     Tenderness: There is no abdominal tenderness. There is no guarding or rebound.  Genitourinary:    Penis: No tenderness.   Musculoskeletal:        General: No tenderness. Normal range of motion.     Cervical back: Neck supple.  Lymphadenopathy:     Cervical: No cervical adenopathy.  Skin:    General: Skin  is warm and dry.     Coloration: Skin is not pale.     Findings: No erythema or rash.  Neurological:     Mental Status: He is alert and oriented to person, place, and time.     Cranial Nerves: No cranial nerve deficit.     Motor: No abnormal muscle tone.     Coordination: Coordination normal.     Deep Tendon Reflexes: Reflexes are normal and symmetric. Reflexes normal.  Psychiatric:        Behavior: Behavior normal.        Thought Content: Thought content normal.        Judgment: Judgment normal.          Assessment & Plan:  Well exam. We discussed diet and exercise. Get fasting labs.  Alysia Penna, MD

## 2022-01-25 ENCOUNTER — Telehealth: Payer: Self-pay | Admitting: Family Medicine

## 2022-01-25 ENCOUNTER — Telehealth (INDEPENDENT_AMBULATORY_CARE_PROVIDER_SITE_OTHER): Payer: Managed Care, Other (non HMO) | Admitting: Family Medicine

## 2022-01-25 ENCOUNTER — Encounter: Payer: Self-pay | Admitting: Family Medicine

## 2022-01-25 DIAGNOSIS — U071 COVID-19: Secondary | ICD-10-CM | POA: Diagnosis not present

## 2022-01-25 MED ORDER — NIRMATRELVIR/RITONAVIR (PAXLOVID)TABLET: 3.0000 | ORAL_TABLET | Freq: Two times a day (BID) | ORAL | 0 refills | Status: AC

## 2022-01-25 NOTE — Telephone Encounter (Signed)
Please advise 

## 2022-01-25 NOTE — Telephone Encounter (Signed)
This is ALREADY in the RX. The GFR is 90

## 2022-01-25 NOTE — Telephone Encounter (Signed)
Mahtomedi with [facility] is calling and needs pt GFR before filling nirmatrelvir/ritonavir EUA (PAXLOVID) 20 x 150 MG & 10 x 100MG  TABS  [id number] - [place], [place] Phone:  [phone]  Fax:  [phone]

## 2022-01-25 NOTE — Progress Notes (Signed)
Subjective:    Patient ID: Nicholas Moore, male    DOB: [birth year], 66 y.o.   MRN: [id number]  HPI Virtual Visit via Video Note  I connected with the patient on 01/25/22 at  2:00 PM EST by a video enabled telemedicine application and verified that I am speaking with the correct person using two identifiers.  Location patient: home Location provider:work or home office Persons participating in the virtual visit: patient, provider  I discussed the limitations of evaluation and management by telemedicine and the availability of in person appointments. The patient expressed understanding and agreed to proceed.   HPI: Here for a Covid-19 infection. About 3 days ago he developed fever, body aches, ST, and adry cough. No chest pain or SOB. Taking Ibuprofen. He tested positive for the Covid virus 2 days ago.    ROS: See pertinent positives and negatives per HPI.  Past Medical History:  Diagnosis Date   Allergy    Anxiety    ED (erectile dysfunction)    GERD (gastroesophageal reflux disease)    diet controlled   History of hypotestosteronemia    sees Dr. Preston Fleeting   Hyperlipidemia    Hypertension    no meds   Melanoma ([place])    dr Allyson Sabal - left upper back   Pituitary adenoma with extrasellar extension [facility])    sees Dr. Roque Cash    Sleep apnea    sees Dr. Danton Sewer, wears CPAP nightly   Tubular adenoma of colon 05/2002    Past Surgical History:  Procedure Laterality Date   COLONOSCOPY  10/30/2018   per Dr. Fuller Plan, adenomatous polyp, repeat in 5 yrs    KNEE ARTHROSCOPY Left    SEPTOPLASTY  2007   SKIN LESION EXCISION     sees Dr. Nena Polio  - left back   TONSILLECTOMY  2007   WISDOM TOOTH EXTRACTION      Family History  Problem Relation Age of Onset   Alcohol abuse Other    Coronary artery disease Other    Hypertension Other    Emphysema Other    Colon cancer Neg Hx    Pancreatic cancer Neg Hx    Stomach cancer Neg Hx    Rectal cancer Neg Hx       Current Outpatient Medications:    atorvastatin (LIPITOR) 40 MG tablet, Take 1 tablet (40 mg total) by mouth daily., Disp: 90 tablet, Rfl: 3   cabergoline (DOSTINEX) 0.5 MG tablet, Take 0.5 mg by mouth 2 (two) times a week., Disp: , Rfl:    nirmatrelvir/ritonavir EUA (PAXLOVID) 20 x 150 MG & 10 x 100MG  TABS, Take 3 tablets by mouth 2 (two) times daily for 5 days. (Take nirmatrelvir 150 mg two tablets twice daily for 5 days and ritonavir 100 mg one tablet twice daily for 5 days) Patient GFR is 90, Disp: 30 tablet, Rfl: 0   sertraline (ZOLOFT) 100 MG tablet, Take 1 tablet (100 mg total) by mouth daily. Take one tab by mouth daily, Disp: 90 tablet, Rfl: 3   Testosterone 30 MG/ACT SOLN, Place onto the skin., Disp: , Rfl:   EXAM:  VITALS per patient if applicable:  GENERAL: alert, oriented, appears well and in no acute distress  HEENT: atraumatic, conjunttiva clear, no obvious abnormalities on inspection of external nose and ears  NECK: normal movements of the head and neck  LUNGS: on inspection no signs of respiratory distress, breathing rate appears normal, no obvious gross SOB, gasping or wheezing  CV: no obvious cyanosis  MS: moves all visible extremities without noticeable abnormality  PSYCH/NEURO: pleasant and cooperative, no obvious depression or anxiety, speech and thought processing grossly intact  ASSESSMENT AND PLAN: Covid infection. Treat with 5 days of Paxlovid. Recheck as needed.  Alysia Penna, MD  Discussed the following assessment and plan:  No diagnosis found.     I discussed the assessment and treatment plan with the patient. The patient was provided an opportunity to ask questions and all were answered. The patient agreed with the plan and demonstrated an understanding of the instructions.   The patient was advised to call back or seek an in-person evaluation if the symptoms worsen or if the condition fails to improve as anticipated.      Review of  Systems     Objective:   Physical Exam        Assessment & Plan:

## 2022-01-25 NOTE — Telephone Encounter (Signed)
Patient calling in with respiratory symptoms: Shortness of breath, chest pain, palpitations or other red words send to Triage  Does the patient have a fever over 100, cough, congestion, sore throat, runny nose, lost of taste/smell (please list symptoms that patient has)?sinus ha, cough with mucus, sore throat  What date did symptoms start?01-23-2022 (If over 5 days ago, pt may be scheduled for in person visit)  Have you tested for Covid in the last 5 days? Yes   If yes, was it positive [x]  OR negative [] ? If positive in the last 5 days, please schedule virtual visit now. If negative, schedule for an in person OV with the next available provider if PCP has no openings. Please also let patient know they will be tested again (follow the script below)  "you will have to arrive 40mins prior to your appt time to be Covid tested. Please park in back of office at the [facility] & call [phone] to let the staff know you have arrived. A staff member will meet you at your car to do a rapid covid test. Once the test has resulted you will be notified by phone of your results to determine if appt will remain an in person visit or be converted to a virtual/phone visit. If you arrive less than 73mins before your appt time, your visit will be automatically converted to virtual & any recommended testing will happen AFTER the visit." Pt has virtual with dr fry 01-25-2022 2 pm  THINGS TO REMEMBER  If no availability for virtual visit in office,  please schedule another [place] office  If no availability at another [place] office, please instruct patient that they can schedule an evisit or virtual visit through their mychart account. Visits up to 8pm  patients can be seen in office 5 days after positive COVID test

## 2022-01-26 NOTE — Telephone Encounter (Signed)
Called [facility] spoke with Mickel Baas, information given.

## 2022-05-16 ENCOUNTER — Emergency Department (HOSPITAL_COMMUNITY)
Admission: EM | Admit: 2022-05-16 | Discharge: 2022-05-16 | Disposition: A | Discharge status: Home / Self Care | Payer: Managed Care, Other (non HMO) | Attending: Emergency Medicine | Admitting: Emergency Medicine

## 2022-05-16 ENCOUNTER — Emergency Department (HOSPITAL_COMMUNITY): Payer: Managed Care, Other (non HMO)

## 2022-05-16 ENCOUNTER — Other Ambulatory Visit: Payer: Self-pay

## 2022-05-16 ENCOUNTER — Encounter (HOSPITAL_COMMUNITY): Payer: Self-pay

## 2022-05-16 DIAGNOSIS — S39012A Strain of muscle, fascia and tendon of lower back, initial encounter: Secondary | ICD-10-CM | POA: Insufficient documentation

## 2022-05-16 DIAGNOSIS — W11XXXA Fall on and from ladder, initial encounter: Secondary | ICD-10-CM | POA: Diagnosis not present

## 2022-05-16 DIAGNOSIS — S3992XA Unspecified injury of lower back, initial encounter: Secondary | ICD-10-CM | POA: Diagnosis present

## 2022-05-16 DIAGNOSIS — R519 Headache, unspecified: Secondary | ICD-10-CM | POA: Diagnosis not present

## 2022-05-16 DIAGNOSIS — W19XXXA Unspecified fall, initial encounter: Secondary | ICD-10-CM

## 2022-05-16 DIAGNOSIS — M546 Pain in thoracic spine: Secondary | ICD-10-CM | POA: Diagnosis not present

## 2022-05-16 MED ORDER — KETOROLAC TROMETHAMINE 30 MG/ML IJ SOLN
30.0000 mg | Freq: Once | INTRAMUSCULAR | Status: AC
Start: 2022-05-16 — End: 2022-05-16
  Administered 2022-05-16: 30 mg via INTRAVENOUS
  Filled 2022-05-16: qty 1

## 2022-05-16 MED ORDER — CYCLOBENZAPRINE HCL 10 MG PO TABS: 10.0000 mg | ORAL_TABLET | Freq: Two times a day (BID) | ORAL | 0 refills | Status: DC | PRN

## 2022-05-16 MED ORDER — ACETAMINOPHEN 325 MG PO TABS: 650.0000 mg | ORAL_TABLET | Freq: Four times a day (QID) | ORAL | 0 refills | Status: DC | PRN

## 2022-05-16 MED ORDER — IBUPROFEN 600 MG PO TABS: 600.0000 mg | ORAL_TABLET | Freq: Four times a day (QID) | ORAL | 0 refills | Status: DC | PRN

## 2022-05-16 MED ORDER — MORPHINE SULFATE (PF) 4 MG/ML IV SOLN: 4.0000 mg | INTRAVENOUS | Status: DC | PRN

## 2022-05-16 NOTE — ED Provider Notes (Signed)
[facility] EMERGENCY DEPARTMENT Provider Note   CSN: [id number] Arrival date & time: 05/16/22  1422     History  Chief Complaint  Patient presents with   Nicholas Moore is a 66 y.o. male present emergency department with a mechanical fall off of a ladder approximately 8 to 10 feet onto his back onto his balcony today.  Patient reports pain in his lower back as well as his left shoulder.  He is not certain whether he struck his head.  He denies loss of consciousness.  He is not on blood thinners.  He denies pain in his hips or lower extremities.  He is here with his family wife and daughter at bedside.  He denies chest pain  HPI     Home Medications Prior to Admission medications   Medication Sig Start Date End Date Taking? Authorizing Provider  acetaminophen (TYLENOL) 325 MG tablet Take 2 tablets (650 mg total) by mouth every 6 (six) hours as needed for up to 30 doses for mild pain or moderate pain. 05/16/22  Yes Ashlyn Cabler, Carola Rhine, MD  cyclobenzaprine (FLEXERIL) 10 MG tablet Take 1 tablet (10 mg total) by mouth 2 (two) times daily as needed for up to 20 doses for muscle spasms. 05/16/22  Yes Merrilee Ancona, Carola Rhine, MD  ibuprofen (ADVIL) 600 MG tablet Take 1 tablet (600 mg total) by mouth every 6 (six) hours as needed for up to 30 doses. 05/16/22  Yes Wyvonnia Dusky, MD  atorvastatin (LIPITOR) 40 MG tablet Take 1 tablet (40 mg total) by mouth daily. 07/03/21   Laurey Morale, MD  cabergoline (DOSTINEX) 0.5 MG tablet Take 0.5 mg by mouth 2 (two) times a week. 10/07/20   [provider]  sertraline (ZOLOFT) 100 MG tablet Take 1 tablet (100 mg total) by mouth daily. Take one tab by mouth daily 07/03/21   Laurey Morale, MD  Testosterone 30 MG/ACT SOLN Place onto the skin.    [provider]      Allergies    Patient has no known allergies.    Review of Systems   Review of Systems  Physical Exam Updated Vital Signs BP 126/81 (BP Location: Right  Arm)   Pulse 75   Temp 98.4 F (36.9 C) (Oral)   Resp 19   Ht '5\' 10"'$  (1.778 m)   Wt 104.3 kg   SpO2 98%   BMI 33.00 kg/m  Physical Exam Constitutional:      General: He is not in acute distress. HENT:     Head: Normocephalic and atraumatic.  Eyes:     Conjunctiva/sclera: Conjunctivae normal.     Pupils: Pupils are equal, round, and reactive to light.  Neck:     Comments: Cervical collar in place Lumbar pain and tenderness Cardiovascular:     Rate and Rhythm: Normal rate and regular rhythm.  Pulmonary:     Effort: Pulmonary effort is normal. No respiratory distress.  Abdominal:     General: There is no distension.     Palpations: There is no mass.     Tenderness: There is no abdominal tenderness.  Musculoskeletal:     Comments: No instability of the pelvis, full range of motion of the extremities  Skin:    General: Skin is warm and dry.  Neurological:     General: No focal deficit present.     Mental Status: He is alert and oriented to person, place, and time. Mental status  is at baseline.     Sensory: No sensory deficit.     Motor: No weakness.  Psychiatric:        Mood and Affect: Mood normal.        Behavior: Behavior normal.    ED Results / Procedures / Treatments   Labs (all labs ordered are listed, but only abnormal results are displayed) Labs Reviewed - No data to display  EKG None  Radiology CT HEAD WO CONTRAST (5MM)  Result Date: 05/16/2022 CLINICAL DATA:  Pain after trauma EXAM: CT HEAD WITHOUT CONTRAST CT CERVICAL SPINE WITHOUT CONTRAST TECHNIQUE: Multidetector CT imaging of the head and cervical spine was performed following the standard protocol without intravenous contrast. Multiplanar CT image reconstructions of the cervical spine were also generated. RADIATION DOSE REDUCTION: This exam was performed according to the departmental dose-optimization program which includes automated exposure control, adjustment of the mA and/or kV according to patient  size and/or use of iterative reconstruction technique. COMPARISON:  None Available. FINDINGS: CT HEAD FINDINGS Brain: No evidence of acute infarction, hemorrhage, hydrocephalus, extra-axial collection or mass lesion/mass effect. Vascular: No hyperdense vessel or unexpected calcification. Skull: Normal. Negative for fracture or focal lesion. Sinuses/Orbits: There is fluid in the left maxillary sinus. There is mild mucosal thickening in the right maxillary sinus. Paranasal sinuses otherwise normal. Left mastoid air cells and bilateral middle ears are well aerated. The right mastoid air cells are normal. Air cells extend into the right petrous bone and are opacified, an age indeterminate finding. Other: No other abnormalities. CT CERVICAL SPINE FINDINGS Alignment: Normal. Skull base and vertebrae: No acute fracture. No primary bone lesion or focal pathologic process. Soft tissues and spinal canal: No prevertebral fluid or swelling. No visible canal hematoma. Disc levels: Multilevel degenerative disc disease with small anterior and posterior osteophytes. Upper chest: Negative. Other: No other abnormalities. IMPRESSION: 1. No acute intracranial abnormalities. 2. Fluid in the left maxillary sinus and mucosal thickening in the right maxillary sinus. 3. The patient's right mastoid air cells extend into the right petrous bone. There is fluid within the air cells within the right petrous bone, an age indeterminate finding. 4. No fracture or traumatic malalignment in the cervical spine. Degenerative changes in the cervical spine. Electronically Signed   By: Dorise Bullion III M.D.   On: 05/16/2022 16:27   CT Chest Wo Contrast  Result Date: 05/16/2022 CLINICAL DATA:  66 year old male with back and LEFT shoulder pain following fall today. Initial encounter. EXAM: CT CHEST WITHOUT CONTRAST TECHNIQUE: Multidetector CT imaging of the chest was performed following the standard protocol without IV contrast. RADIATION DOSE  REDUCTION: This exam was performed according to the departmental dose-optimization program which includes automated exposure control, adjustment of the mA and/or kV according to patient size and/or use of iterative reconstruction technique. COMPARISON:  08/13/2010 chest CT FINDINGS: Cardiovascular: Heart size is within normal limits. No thoracic aortic aneurysm or pericardial effusion. Mediastinum/Nodes: No mediastinal hematoma or mass. No abnormal appearing lymph nodes identified. Visualized thyroid, trachea and esophagus are unremarkable. Lungs/Pleura: There is no evidence of airspace disease, consolidation, mass, suspicious nodule, pleural effusion or pneumothorax. Mild dependent atelectasis and bibasilar scarring again noted. Upper Abdomen: No acute abnormality. Musculoskeletal: No acute or suspicious bony abnormalities are noted. IMPRESSION: 1. No evidence of acute abnormality. No evidence of acute injury or fracture. Electronically Signed   By: Margarette Canada M.D.   On: 05/16/2022 16:25   CT Cervical Spine Wo Contrast  Result Date: 05/16/2022 CLINICAL DATA:  Pain after trauma EXAM: CT HEAD WITHOUT CONTRAST CT CERVICAL SPINE WITHOUT CONTRAST TECHNIQUE: Multidetector CT imaging of the head and cervical spine was performed following the standard protocol without intravenous contrast. Multiplanar CT image reconstructions of the cervical spine were also generated. RADIATION DOSE REDUCTION: This exam was performed according to the departmental dose-optimization program which includes automated exposure control, adjustment of the mA and/or kV according to patient size and/or use of iterative reconstruction technique. COMPARISON:  None Available. FINDINGS: CT HEAD FINDINGS Brain: No evidence of acute infarction, hemorrhage, hydrocephalus, extra-axial collection or mass lesion/mass effect. Vascular: No hyperdense vessel or unexpected calcification. Skull: Normal. Negative for fracture or focal lesion. Sinuses/Orbits:  There is fluid in the left maxillary sinus. There is mild mucosal thickening in the right maxillary sinus. Paranasal sinuses otherwise normal. Left mastoid air cells and bilateral middle ears are well aerated. The right mastoid air cells are normal. Air cells extend into the right petrous bone and are opacified, an age indeterminate finding. Other: No other abnormalities. CT CERVICAL SPINE FINDINGS Alignment: Normal. Skull base and vertebrae: No acute fracture. No primary bone lesion or focal pathologic process. Soft tissues and spinal canal: No prevertebral fluid or swelling. No visible canal hematoma. Disc levels: Multilevel degenerative disc disease with small anterior and posterior osteophytes. Upper chest: Negative. Other: No other abnormalities. IMPRESSION: 1. No acute intracranial abnormalities. 2. Fluid in the left maxillary sinus and mucosal thickening in the right maxillary sinus. 3. The patient's right mastoid air cells extend into the right petrous bone. There is fluid within the air cells within the right petrous bone, an age indeterminate finding. 4. No fracture or traumatic malalignment in the cervical spine. Degenerative changes in the cervical spine. Electronically Signed   By: Dorise Bullion III M.D.   On: 05/16/2022 16:27   CT Lumbar Spine Wo Contrast  Result Date: 05/16/2022 CLINICAL DATA:  66 year old male with mid back and low back pain following fall. EXAM: CT THORACIC SPINE WITHOUT CONTRAST CT LUMBAR SPINE WITHOUT CONTRAST TECHNIQUE: Multiplanar CT images of the thoracic spine were reconstructed from contemporary CT of the Chest. Multidetector CT imaging of the lumbar spine was performed without intravenous contrast administration. Multiplanar CT image reconstructions were also generated. RADIATION DOSE REDUCTION: This exam was performed according to the departmental dose-optimization program which includes automated exposure control, adjustment of the mA and/or kV according to patient  size and/or use of iterative reconstruction technique. COMPARISON:  08/13/2010 chest CT FINDINGS: CT THORACIC SPINE: Alignment: Normal. Vertebrae: No acute fracture or focal pathologic process. Paraspinal and other soft tissues: Negative. Disc levels: Mild multilevel degenerative disc disease noted. CT LUMBAR SPINE: Segmentation: 5 lumbar type vertebrae. Alignment: Normal. Vertebrae: No acute fracture or focal pathologic process. Paraspinal and other soft tissues: Negative. Disc levels: Mild multilevel degenerative disc disease and spondylosis noted. These findings contribute to mild central spinal and biforaminal narrowing at L3-4. IMPRESSION: 1. No evidence of acute injury to the thoracic or lumbar spine. 2. Mild multilevel degenerative disc disease and spondylosis with mild central spinal and biforaminal narrowing at L3-4. Electronically Signed   By: Margarette Canada M.D.   On: 05/16/2022 16:40   CT T-SPINE NO CHARGE  Result Date: 05/16/2022 CLINICAL DATA:  66 year old male with mid back and low back pain following fall. EXAM: CT THORACIC SPINE WITHOUT CONTRAST CT LUMBAR SPINE WITHOUT CONTRAST TECHNIQUE: Multiplanar CT images of the thoracic spine were reconstructed from contemporary CT of the Chest. Multidetector CT imaging of the lumbar spine was performed  without intravenous contrast administration. Multiplanar CT image reconstructions were also generated. RADIATION DOSE REDUCTION: This exam was performed according to the departmental dose-optimization program which includes automated exposure control, adjustment of the mA and/or kV according to patient size and/or use of iterative reconstruction technique. COMPARISON:  08/13/2010 chest CT FINDINGS: CT THORACIC SPINE: Alignment: Normal. Vertebrae: No acute fracture or focal pathologic process. Paraspinal and other soft tissues: Negative. Disc levels: Mild multilevel degenerative disc disease noted. CT LUMBAR SPINE: Segmentation: 5 lumbar type vertebrae.  Alignment: Normal. Vertebrae: No acute fracture or focal pathologic process. Paraspinal and other soft tissues: Negative. Disc levels: Mild multilevel degenerative disc disease and spondylosis noted. These findings contribute to mild central spinal and biforaminal narrowing at L3-4. IMPRESSION: 1. No evidence of acute injury to the thoracic or lumbar spine. 2. Mild multilevel degenerative disc disease and spondylosis with mild central spinal and biforaminal narrowing at L3-4. Electronically Signed   By: Margarette Canada M.D.   On: 05/16/2022 16:40    Procedures Procedures    Medications Ordered in ED Medications  ketorolac (TORADOL) 30 MG/ML injection 30 mg (30 mg Intravenous Given 05/16/22 1809)    ED Course/ Medical Decision Making/ A&P                           Medical Decision Making Amount and/or Complexity of Data Reviewed Radiology: ordered.  Risk OTC drugs. Prescription drug management.   Patient is here with a mechanical fall complaining of left shoulder and lower back pain.  CT scans of the spine, chest, and head have been ordered.  He has no abdominal pain or tenderness to warrant CT imaging of the abdomen at this time.  Low suspicion for pelvic fracture.  I do not believe imaging of the extremities are indicated at this time.  His pain is moderately controlled after fentanyl given by EMS.  Additional morphine ordered as needed as needed.  CT images reviewed, no acute traumatic injuries.  Patient treated for suspected muscle strain or injury.  He was comfortable and able to ambulate at time of discharge.  Family to take him home.  Flexeril prescribed for home.        Final Clinical Impression(s) / ED Diagnoses Final diagnoses:  Fall, initial encounter  Strain of lumbar region, initial encounter  Acute midline thoracic back pain    Rx / DC Orders ED Discharge Orders          Ordered    cyclobenzaprine (FLEXERIL) 10 MG tablet  2 times daily PRN        05/16/22 1734     ibuprofen (ADVIL) 600 MG tablet  Every 6 hours PRN        05/16/22 1734    acetaminophen (TYLENOL) 325 MG tablet  Every 6 hours PRN        05/16/22 1734              Wyvonnia Dusky, MD 05/16/22 [phone]

## 2022-05-16 NOTE — ED Triage Notes (Signed)
Pt arrived to ED via EMS from his daughter's house where he was on a ladder fixing the roof, slipped d/t ladder being wet and fell 24f landing on his back. Pt initially c/o lower cervical pain and EMS placed C-collar on pt. Pt then c/o L shoulder pain. EMS started 16g L AC and gave 562m of fentanyl. GCS 15. HR 80, BP 152/100, O2 98% RA.

## 2022-05-16 NOTE — ED Notes (Signed)
Pt transported to CT ?

## 2022-05-16 NOTE — ED Notes (Signed)
Reviewed discharge instructions with patient and family. Follow-up care and medications reviewed. Patient and family verbalized understanding. Patient A&Ox4, VSS, and ambulatory with steady gait upon discharge.  

## 2022-06-01 ENCOUNTER — Ambulatory Visit (INDEPENDENT_AMBULATORY_CARE_PROVIDER_SITE_OTHER): Payer: Managed Care, Other (non HMO) | Admitting: Family Medicine

## 2022-06-01 ENCOUNTER — Encounter: Payer: Self-pay | Admitting: Family Medicine

## 2022-06-01 VITALS — BP 120/78 | HR 64 | Temp 98.1°F | Wt 238.0 lb

## 2022-06-01 DIAGNOSIS — S40012D Contusion of left shoulder, subsequent encounter: Secondary | ICD-10-CM | POA: Diagnosis not present

## 2022-06-01 DIAGNOSIS — S300XXD Contusion of lower back and pelvis, subsequent encounter: Secondary | ICD-10-CM | POA: Diagnosis not present

## 2022-06-01 DIAGNOSIS — S80811D Abrasion, right lower leg, subsequent encounter: Secondary | ICD-10-CM

## 2022-06-01 NOTE — Progress Notes (Signed)
   Subjective:    Patient ID: Nicholas Moore, male    DOB: 10-08-1956, 66 y.o.   MRN: [id number]  HPI Here to follow up an ED visit on 05-16-22 after he fell 8-10 feet off a ladder at his daughter's home. He landed on a balcony on his left shoulder and back. No LOC. At the ED he had CT scans of the head and the entire spine, and all these were negative. He a;los had an abrasion to the right lower leg. He was sent home on Flexeril and Iburpofen. Since then he has done well. He has some stiffness and soreness at times, but nothing major. He has been seeing his chiropractor twice a week and this has been helpful.    Review of Systems  Constitutional: Negative.   Respiratory: Negative.    Cardiovascular: Negative.   Musculoskeletal:  Positive for back pain.  Skin:  Positive for wound.      Objective:   Physical Exam Constitutional:      General: He is not in acute distress.    Appearance: Normal appearance.  Cardiovascular:     Rate and Rhythm: Normal rate and regular rhythm.     Pulses: Normal pulses.     Heart sounds: Normal heart sounds.  Pulmonary:     Effort: Pulmonary effort is normal.     Breath sounds: Normal breath sounds.  Musculoskeletal:     Comments: His neck and lower back are normal, with no tenderness and full ROM. The left shoulder is normal.   Skin:    Comments: There is a small abrasion on the right lower leg with a scab over it. There is some erythema around it, but no warmth or tenderness   Neurological:     Mental Status: He is alert.          Assessment & Plan:  He has some contusions and abrasions from the fall, but these are resolving as expected. He can follow up as needed. We spent a total of ( 33  ) minutes reviewing records and discussing these issues.  Alysia Penna, MD

## 2022-09-22 ENCOUNTER — Other Ambulatory Visit: Payer: Self-pay | Admitting: Family Medicine

## 2022-10-08 ENCOUNTER — Encounter: Payer: Self-pay | Admitting: Family Medicine

## 2022-10-08 ENCOUNTER — Ambulatory Visit (INDEPENDENT_AMBULATORY_CARE_PROVIDER_SITE_OTHER): Payer: Managed Care, Other (non HMO) | Admitting: Family Medicine

## 2022-10-08 VITALS — BP 110/78 | HR 62 | Temp 98.2°F | Wt 234.0 lb

## 2022-10-08 DIAGNOSIS — M546 Pain in thoracic spine: Secondary | ICD-10-CM

## 2022-10-08 DIAGNOSIS — M6208 Separation of muscle (nontraumatic), other site: Secondary | ICD-10-CM | POA: Diagnosis not present

## 2022-10-08 MED ORDER — PHENTERMINE HCL 37.5 MG PO CAPS: 37.5000 mg | ORAL_CAPSULE | ORAL | 1 refills | Status: DC

## 2022-10-08 NOTE — Progress Notes (Signed)
   Subjective:    Patient ID: Nicholas Moore, male    DOB: [birth year], 66 y.o.   MRN: [id number]  HPI Here for several issues. First he complains of an intermittent sharp pain in the middle of the back that started about 4 weeks ago. No recent trauma. He gets relief by taking Ibuprofen. A CT scan of his thoracic spine this past May revealed some degenerative disc disease. He has been lifting weights and doing sit ups lately to try to lose weight. Second he recently noticed a bulge appear in the center of his abdomen when he does a sit up. This is not painful. Third he asks for help with losing weight. He has been overweight his while life, and he says her parents were both overweight. He tries to eat a healthy diet, but he says he is hungry all the time. He is exercising as above.   Review of Systems  Constitutional: Negative.   Respiratory: Negative.    Cardiovascular: Negative.   Gastrointestinal: Negative.   Musculoskeletal:  Positive for back pain.       Objective:   Physical Exam Constitutional:      General: He is not in acute distress.    Appearance: He is obese.  Cardiovascular:     Rate and Rhythm: Normal rate and regular rhythm.     Pulses: Normal pulses.     Heart sounds: Normal heart sounds.  Pulmonary:     Effort: Pulmonary effort is normal.     Breath sounds: Normal breath sounds.  Abdominal:     General: Abdomen is flat. Bowel sounds are normal.     Palpations: Abdomen is soft. There is no mass.     Tenderness: There is no abdominal tenderness. There is no guarding or rebound.     Hernia: No hernia is present.     Comments: He has a vertical midline non-tender bulge when he sits up   Neurological:     Mental Status: He is alert.           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 He can use heat and Ibuprofen. Second he has a diastasis  recti, and I explained the benign nature of this. Third he will try Phentermine 37.5 mg every morning to help lose weight. Recheck in 90 days.  Alysia Penna, MD

## 2022-11-26 ENCOUNTER — Encounter: Payer: Self-pay | Admitting: Family Medicine

## 2022-11-26 ENCOUNTER — Ambulatory Visit (INDEPENDENT_AMBULATORY_CARE_PROVIDER_SITE_OTHER): Payer: Managed Care, Other (non HMO) | Admitting: Family Medicine

## 2022-11-26 VITALS — BP 122/80 | HR 63 | Temp 98.4°F | Ht 69.0 in | Wt 229.2 lb

## 2022-11-26 DIAGNOSIS — Z0000 Encounter for general adult medical examination without abnormal findings: Secondary | ICD-10-CM

## 2022-11-26 DIAGNOSIS — Z23 Encounter for immunization: Secondary | ICD-10-CM | POA: Diagnosis not present

## 2022-11-26 LAB — LIPID PANEL
Cholesterol: 145 mg/dL (ref 0–200)
HDL: 52.7 mg/dL (ref >39.00)
LDL Cholesterol: 76 mg/dL (ref 0–99)
NonHDL: 91.97
Total CHOL/HDL Ratio: 3
Triglycerides: 80 mg/dL (ref 0.0–149.0)
VLDL: 16 mg/dL (ref 0.0–40.0)

## 2022-11-26 LAB — BASIC METABOLIC PANEL
BUN: 18 mg/dL (ref 6–23)
CO2: 28 mEq/L (ref 19–32)
Calcium: 9.3 mg/dL (ref 8.4–10.5)
Chloride: 102 mEq/L (ref 96–112)
Creatinine, Ser: 0.89 mg/dL (ref 0.40–1.50)
GFR: 89.63 mL/min (ref >60.00)
Glucose, Bld: 93 mg/dL (ref 70–99)
Potassium: 4.4 mEq/L (ref 3.5–5.1)
Sodium: 138 mEq/L (ref 135–145)

## 2022-11-26 LAB — CBC WITH DIFFERENTIAL/PLATELET
Basophils Absolute: 0.1 10*3/uL (ref 0.0–0.1)
Basophils Relative: 1 % (ref 0.0–3.0)
Eosinophils Absolute: 0.1 10*3/uL (ref 0.0–0.7)
Eosinophils Relative: 2.4 % (ref 0.0–5.0)
HCT: 45.4 % (ref 39.0–52.0)
Hemoglobin: 15.5 g/dL (ref 13.0–17.0)
Lymphocytes Relative: 31.4 % (ref 12.0–46.0)
Lymphs Abs: 1.8 10*3/uL (ref 0.7–4.0)
MCHC: 34.2 g/dL (ref 30.0–36.0)
MCV: 91.1 fl (ref 78.0–100.0)
Monocytes Absolute: 0.5 10*3/uL (ref 0.1–1.0)
Monocytes Relative: 8.6 % (ref 3.0–12.0)
Neutro Abs: 3.2 10*3/uL (ref 1.4–7.7)
Neutrophils Relative %: 56.6 % (ref 43.0–77.0)
Platelets: 281 10*3/uL (ref 150.0–400.0)
RBC: 4.98 Mil/uL (ref 4.22–5.81)
RDW: 13.4 % (ref 11.5–15.5)
WBC: 5.6 10*3/uL (ref 4.0–10.5)

## 2022-11-26 LAB — HEPATIC FUNCTION PANEL
ALT: 23 U/L (ref 0–53)
AST: 23 U/L (ref 0–37)
Albumin: 4.4 g/dL (ref 3.5–5.2)
Alkaline Phosphatase: 66 U/L (ref 39–117)
Bilirubin, Direct: 0.2 mg/dL (ref 0.0–0.3)
Total Bilirubin: 0.8 mg/dL (ref 0.2–1.2)
Total Protein: 6.8 g/dL (ref 6.0–8.3)

## 2022-11-26 LAB — PSA: PSA: 0.26 ng/mL (ref 0.10–4.00)

## 2022-11-26 LAB — TSH: TSH: 4.03 u[IU]/mL (ref 0.35–5.50)

## 2022-11-26 LAB — HEMOGLOBIN A1C: Hgb A1c MFr Bld: 5.8 % (ref 4.6–6.5)

## 2022-11-26 NOTE — Addendum Note (Signed)
Addended by: Wyvonne Lenz on: 11/26/2022 11:01 AM   Modules accepted: Orders

## 2022-11-26 NOTE — Progress Notes (Signed)
Subjective:    Patient ID: Nicholas Moore, male    DOB: 1956-07-25, 66 y.o.   MRN: [id number]  HPI Here for a well exam. He feels fine. He saw [place] recently for a thoracic back pain, but after he stopped lifting weights this resolved.    Review of Systems  Constitutional: Negative.   HENT: Negative.    Eyes: Negative.   Respiratory: Negative.    Cardiovascular: Negative.   Gastrointestinal: Negative.   Genitourinary: Negative.   Musculoskeletal: Negative.   Skin: Negative.   Neurological: Negative.   Psychiatric/Behavioral: Negative.         Objective:   Physical Exam Constitutional:      General: He is not in acute distress.    Appearance: Normal appearance. He is well-developed. He is not diaphoretic.  HENT:     Head: Normocephalic and atraumatic.     Right Ear: External ear normal.     Left Ear: External ear normal.     Nose: Nose normal.     Mouth/Throat:     Pharynx: No oropharyngeal exudate.  Eyes:     General: No scleral icterus.       Right eye: No discharge.        Left eye: No discharge.     Conjunctiva/sclera: Conjunctivae normal.     Pupils: Pupils are equal, round, and reactive to light.  Neck:     Thyroid: No thyromegaly.     Vascular: No JVD.     Trachea: No tracheal deviation.  Cardiovascular:     Rate and Rhythm: Normal rate and regular rhythm.     Heart sounds: Normal heart sounds. No murmur heard.    No friction rub. No gallop.  Pulmonary:     Effort: Pulmonary effort is normal. No respiratory distress.     Breath sounds: Normal breath sounds. No wheezing or rales.  Chest:     Chest wall: No tenderness.  Abdominal:     General: Bowel sounds are normal. There is no distension.     Palpations: Abdomen is soft. There is no mass.     Tenderness: There is no abdominal tenderness. There is no guarding or rebound.  Genitourinary:    Penis: Normal. No tenderness.      Testes: Normal.     Prostate: Normal.     Rectum: Normal. Guaiac result  negative.  Musculoskeletal:        General: No tenderness. Normal range of motion.     Cervical back: Neck supple.  Lymphadenopathy:     Cervical: No cervical adenopathy.  Skin:    General: Skin is warm and dry.     Coloration: Skin is not pale.     Findings: No erythema or rash.  Neurological:     Mental Status: He is alert and oriented to person, place, and time.     Cranial Nerves: No cranial nerve deficit.     Motor: No abnormal muscle tone.     Coordination: Coordination normal.     Deep Tendon Reflexes: Reflexes are normal and symmetric. Reflexes normal.  Psychiatric:        Mood and Affect: Mood normal.        Behavior: Behavior normal.        Thought Content: Thought content normal.        Judgment: Judgment normal.           Assessment & Plan:  Well exam. We discussed diet and exercise. Get fasting labs. Annie Main  Sarajane Jews, MD

## 2022-12-07 ENCOUNTER — Other Ambulatory Visit: Payer: Self-pay | Admitting: Family Medicine

## 2022-12-08 IMAGING — CT CT L SPINE W/O CM
3 series · 13 of 33 positions shown, 16 images · non-contrast
Comparison: 08/13/2010 chest CT

CLINICAL DATA: 65-year-old male with mid back and low back pain
following fall.

EXAM:
CT THORACIC SPINE WITHOUT CONTRAST
CT LUMBAR SPINE WITHOUT CONTRAST
TECHNIQUE: Multiplanar CT images of the thoracic spine were reconstructed from
contemporary CT of the Chest.

[Series 3: l-spine 2.0 st · axial · 0.35mm/px · z∈[-820,-666]mm · 5 of 111 slices shown, 7 images]
[im 17/111  soft-tissue]
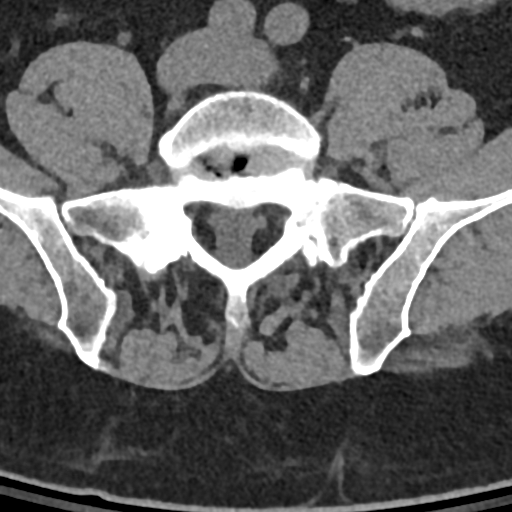
[im 17/111  bone]
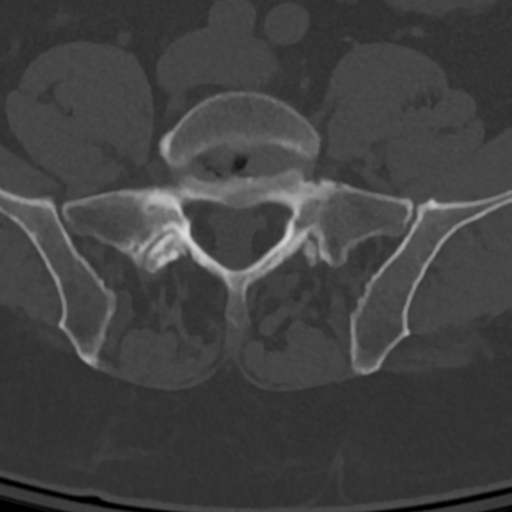
[im 34/111  bone]
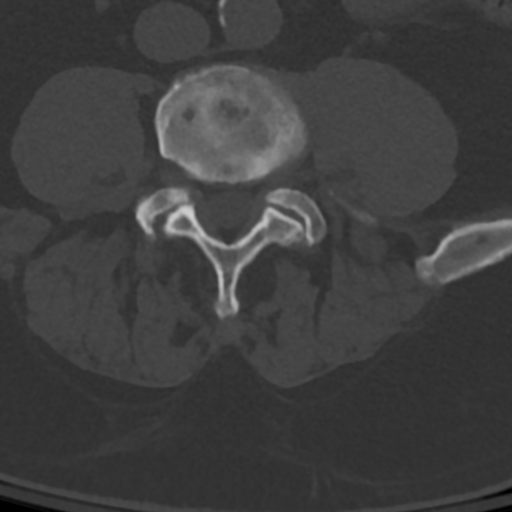
[im 60/111  bone]
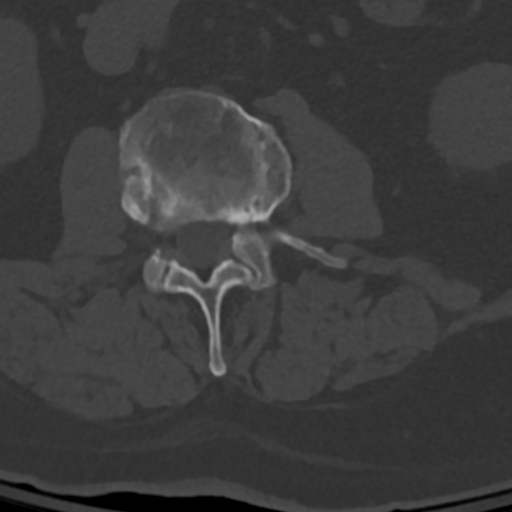
[im 77/111  bone]
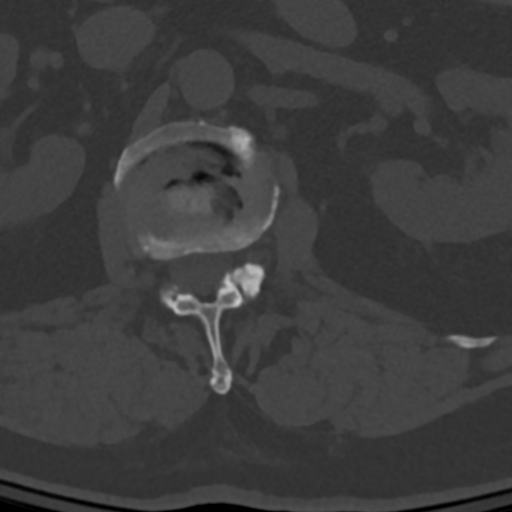
[im 94/111  soft-tissue]
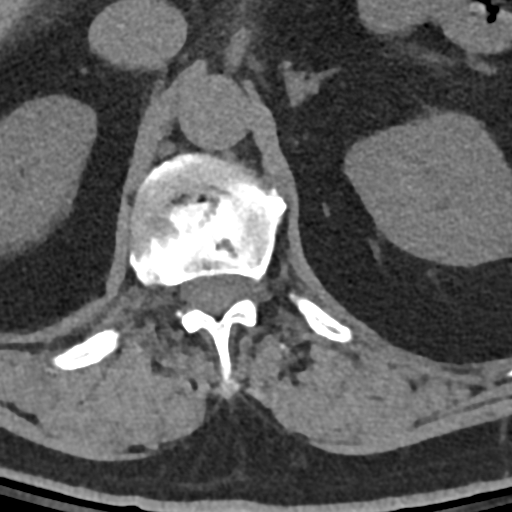
[im 94/111  bone]
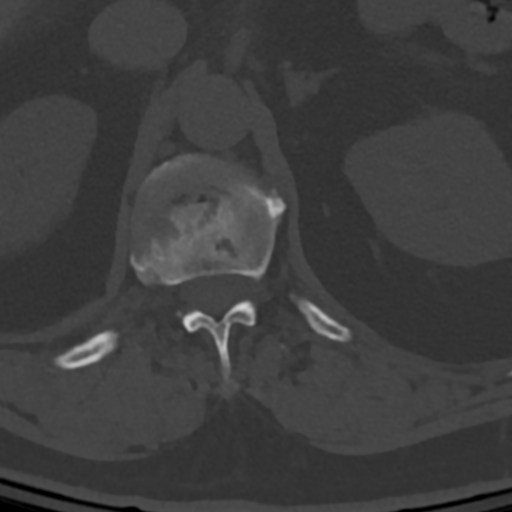

[Series 7: l-spine 2.0 sag · sagittal · 0.37mm/px · 5 of 76 slices shown, 6 images]
[im 26/76  bone]
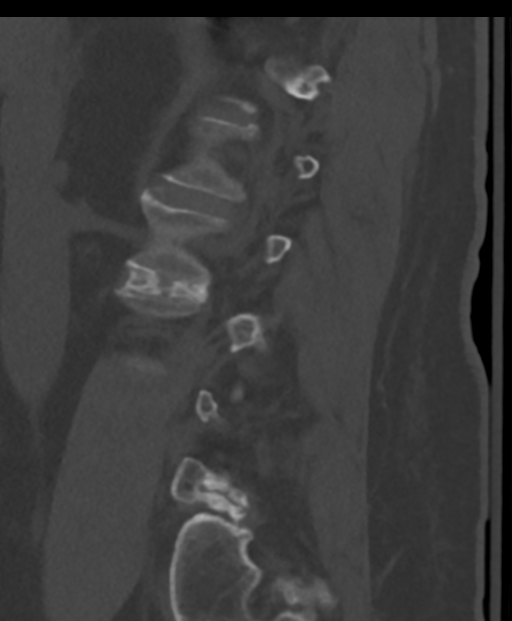
[im 32/76  bone]
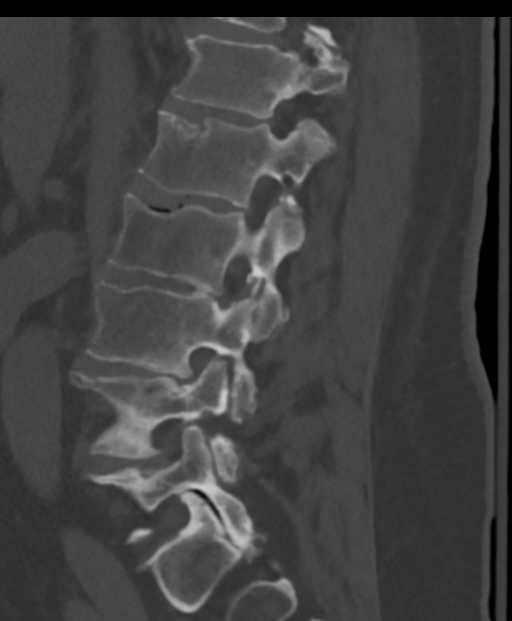
[im 38/76  soft-tissue]
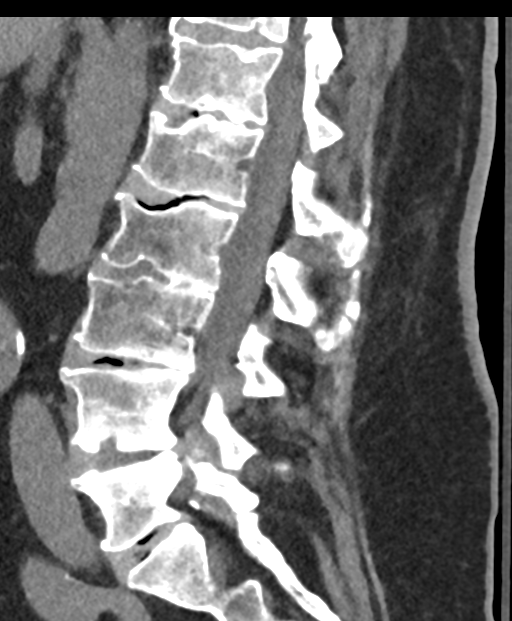
[im 38/76  bone]
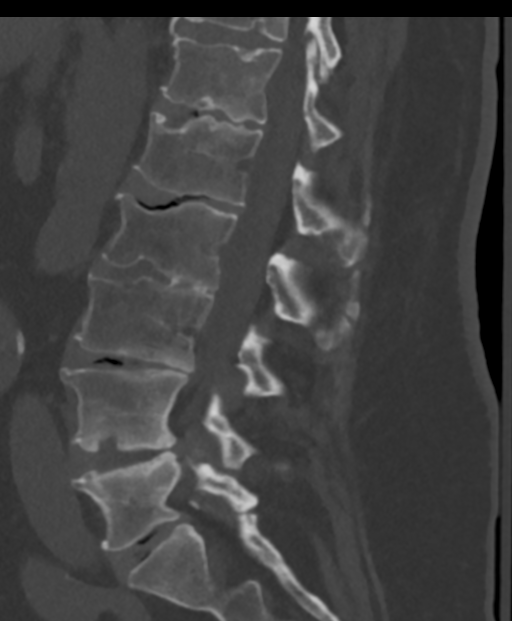
[im 44/76  bone]
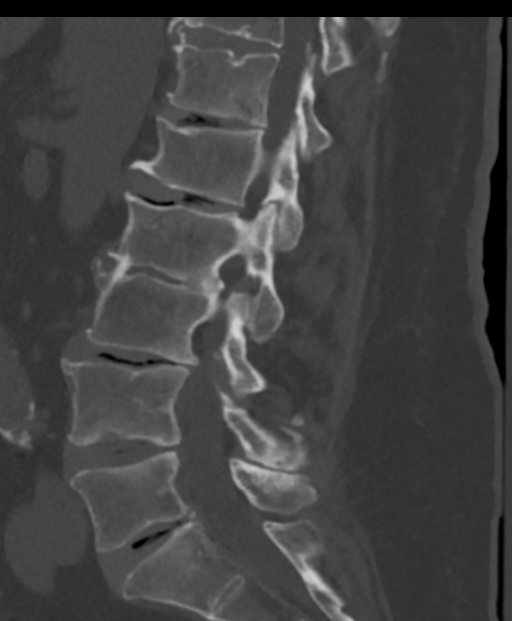
[im 51/76  bone]
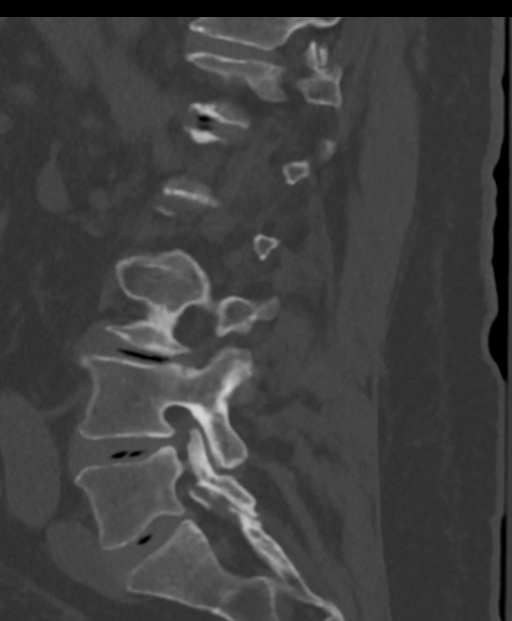

[Series 8: l-spine 2.0 cor · coronal · 0.29mm/px · 3 of 100 slices shown]
[im 20/100  bone]
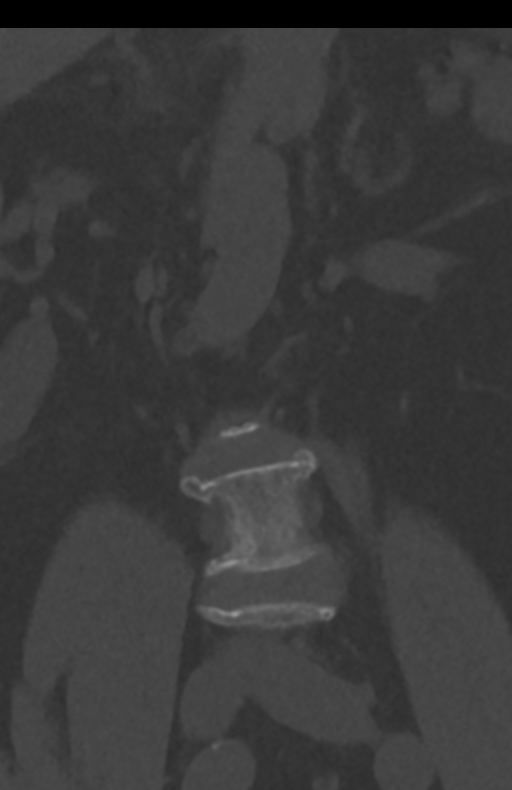
[im 40/100  bone]
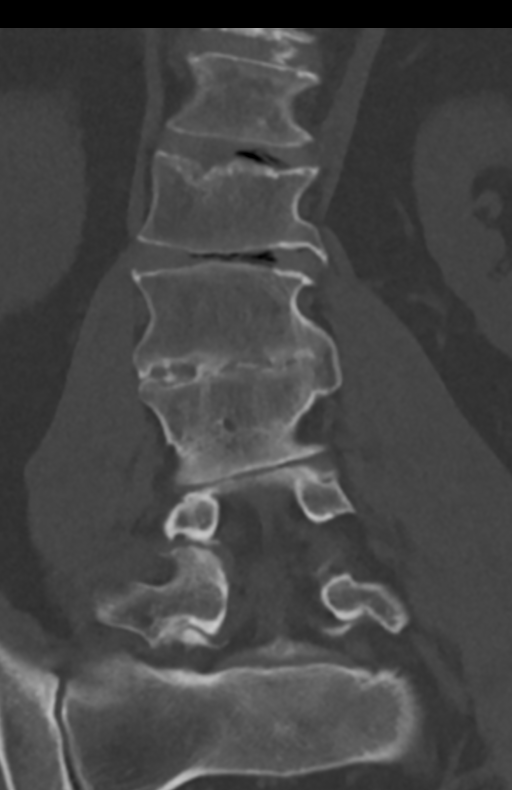
[im 60/100  bone]
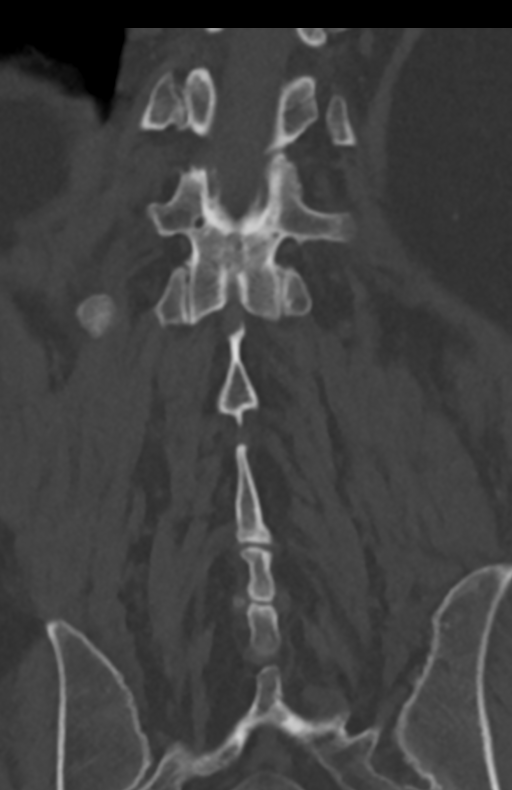

[13 of 33 positions shown; findings below may reference images not displayed]

Multidetector CT imaging of the lumbar spine was performed without
intravenous contrast administration. Multiplanar CT image
reconstructions were also generated.

RADIATION DOSE REDUCTION: This exam was performed according to the
departmental dose-optimization program which includes automated
exposure control, adjustment of the mA and/or kV according to
patient size and/or use of iterative reconstruction technique.
FINDINGS: CT THORACIC SPINE:

Alignment: Normal.

Vertebrae: No acute fracture or focal pathologic process.

Paraspinal and other soft tissues: Negative.

Disc levels: Mild multilevel degenerative disc disease noted.

CT LUMBAR SPINE:

Segmentation: 5 lumbar type vertebrae.

Alignment: Normal.

Vertebrae: No acute fracture or focal pathologic process.

Paraspinal and other soft tissues: Negative.

Disc levels: Mild multilevel degenerative disc disease and
spondylosis noted. These findings contribute to mild central spinal
and biforaminal narrowing at L3-4.
IMPRESSION: 1. No evidence of acute injury to the thoracic or lumbar spine.
2. Mild multilevel degenerative disc disease and spondylosis with
mild central spinal and biforaminal narrowing at L3-4.

## 2023-01-25 ENCOUNTER — Telehealth: Payer: Self-pay | Admitting: Family Medicine

## 2023-01-25 NOTE — Telephone Encounter (Signed)
Pt dropped off [employer] pppwk that stated they are now covering 90-day supply Rx's and he currently has two under [employer] that Dr. Prescribes.   Pt wanted the ppwk to be placed in his chart and also wanted to know if Dr was going to rewrite his Rx's for it to be 90-day supply?  [place] has been placed in Dr box.   Please advise.

## 2023-01-27 NOTE — Telephone Encounter (Signed)
Pt paper sent to scanning per pt

## 2023-03-01 ENCOUNTER — Other Ambulatory Visit: Payer: Self-pay | Admitting: Family Medicine

## 2023-04-30 ENCOUNTER — Other Ambulatory Visit: Payer: Self-pay | Admitting: Family Medicine

## 2023-07-12 ENCOUNTER — Other Ambulatory Visit: Payer: Self-pay | Admitting: Family Medicine

## 2023-08-15 ENCOUNTER — Other Ambulatory Visit: Payer: Self-pay | Admitting: Endocrinology

## 2023-08-15 DIAGNOSIS — D352 Benign neoplasm of pituitary gland: Secondary | ICD-10-CM

## 2023-10-07 ENCOUNTER — Other Ambulatory Visit: Payer: Self-pay | Admitting: Family Medicine

## 2023-10-12 ENCOUNTER — Ambulatory Visit: Payer: Managed Care, Other (non HMO) | Admitting: Family Medicine

## 2023-10-12 ENCOUNTER — Ambulatory Visit: Payer: Managed Care, Other (non HMO)

## 2023-10-12 ENCOUNTER — Encounter: Payer: Self-pay | Admitting: Family Medicine

## 2023-10-12 VITALS — BP 126/76 | HR 61 | Temp 98.2°F | Wt 240.0 lb

## 2023-10-12 DIAGNOSIS — R5383 Other fatigue: Secondary | ICD-10-CM

## 2023-10-12 DIAGNOSIS — R053 Chronic cough: Secondary | ICD-10-CM

## 2023-10-12 DIAGNOSIS — R635 Abnormal weight gain: Secondary | ICD-10-CM | POA: Diagnosis not present

## 2023-10-12 DIAGNOSIS — R739 Hyperglycemia, unspecified: Secondary | ICD-10-CM | POA: Diagnosis not present

## 2023-10-12 NOTE — Progress Notes (Signed)
   Subjective:    Patient ID: Nicholas Moore, male    DOB: [birth year], 67 y.o.   MRN: [id number]  HPI Here to discuss a cough that started 8 months ago and for generalized fatigue. He does not feel ill per se. The cough produces a little clear sputum in the mornings, and then it stops before noon. He does have mild SOB with exertion (he describes walking to his mailbox). No chest pain or palpitations. He has also gained 10 lbs since December. He still watches his diet closely and he works out with a Systems analyst.    Review of Systems  Constitutional:  Positive for fatigue and unexpected weight change.  Respiratory:  Positive for cough and shortness of breath. Negative for wheezing.   Cardiovascular: Negative.   Gastrointestinal: Negative.   Genitourinary: Negative.        Objective:   Physical Exam Constitutional:      Appearance: He is obese.  Cardiovascular:     Rate and Rhythm: Normal rate and regular rhythm.     Pulses: Normal pulses.     Heart sounds: Normal heart sounds.  Pulmonary:     Effort: Pulmonary effort is normal.     Breath sounds: Normal breath sounds.  Musculoskeletal:     Right lower leg: No edema.     Left lower leg: No edema.  Neurological:     General: No focal deficit present.     Mental Status: He is alert and oriented to person, place, and time.           Assessment & Plan:  His cough sounds like it may be allergic in nature. We will get a CXR today. The weight gain and fatigue could be from several issues, so we will get labs today including a throid panel.  Gershon Crane, MD

## 2023-10-13 ENCOUNTER — Other Ambulatory Visit: Payer: Self-pay | Admitting: Family Medicine

## 2023-10-13 LAB — BASIC METABOLIC PANEL
BUN: 19 mg/dL (ref 6–23)
CO2: 25 meq/L (ref 19–32)
Calcium: 9.5 mg/dL (ref 8.4–10.5)
Chloride: 103 meq/L (ref 96–112)
Creatinine, Ser: 0.94 mg/dL (ref 0.40–1.50)
GFR: 84.27 mL/min (ref >60.00)
Glucose, Bld: 75 mg/dL (ref 70–99)
Potassium: 4.4 meq/L (ref 3.5–5.1)
Sodium: 138 meq/L (ref 135–145)

## 2023-10-13 LAB — TSH: TSH: 2.99 u[IU]/mL (ref 0.35–5.50)

## 2023-10-13 LAB — T3, FREE: T3, Free: 3.5 pg/mL (ref 2.3–4.2)

## 2023-10-13 LAB — CBC WITH DIFFERENTIAL/PLATELET
Basophils Absolute: 0.1 10*3/uL (ref 0.0–0.1)
Basophils Relative: 1.1 % (ref 0.0–3.0)
Eosinophils Absolute: 0.2 10*3/uL (ref 0.0–0.7)
Eosinophils Relative: 2.1 % (ref 0.0–5.0)
HCT: 54.6 % — ABNORMAL HIGH (ref 39.0–52.0)
Hemoglobin: 17.9 g/dL — ABNORMAL HIGH (ref 13.0–17.0)
Lymphocytes Relative: 24.3 % (ref 12.0–46.0)
Lymphs Abs: 1.9 10*3/uL (ref 0.7–4.0)
MCHC: 32.7 g/dL (ref 30.0–36.0)
MCV: 93.7 fL (ref 78.0–100.0)
Monocytes Absolute: 0.9 10*3/uL (ref 0.1–1.0)
Monocytes Relative: 11.1 % (ref 3.0–12.0)
Neutro Abs: 4.8 10*3/uL (ref 1.4–7.7)
Neutrophils Relative %: 61.4 % (ref 43.0–77.0)
Platelets: 262 10*3/uL (ref 150.0–400.0)
RBC: 5.83 Mil/uL — ABNORMAL HIGH (ref 4.22–5.81)
RDW: 14.1 % (ref 11.5–15.5)
WBC: 7.8 10*3/uL (ref 4.0–10.5)

## 2023-10-13 LAB — HEMOGLOBIN A1C: Hgb A1c MFr Bld: 5.5 % (ref 4.6–6.5)

## 2023-10-13 LAB — VITAMIN B12: Vitamin B-12: 415 pg/mL (ref 211–911)

## 2023-10-13 LAB — T4, FREE: Free T4: 0.64 ng/dL (ref 0.60–1.60)

## 2023-10-21 ENCOUNTER — Encounter: Payer: Self-pay | Admitting: Gastroenterology

## 2023-11-17 ENCOUNTER — Encounter: Payer: Self-pay | Admitting: Gastroenterology

## 2023-11-17 ENCOUNTER — Ambulatory Visit: Payer: Managed Care, Other (non HMO) | Admitting: *Deleted

## 2023-11-17 VITALS — Ht 69.0 in | Wt 230.0 lb

## 2023-11-17 DIAGNOSIS — Z860100 Personal history of colon polyps, unspecified: Secondary | ICD-10-CM

## 2023-11-17 MED ORDER — NA SULFATE-K SULFATE-MG SULF 17.5-3.13-1.6 GM/177ML PO SOLN: 1.0000 | Freq: Once | ORAL | 0 refills | Status: AC

## 2023-11-17 NOTE — Progress Notes (Signed)
Pt's name and DOB verified at the beginning of the pre-visit wit 2 identifiers  Pt denies any difficulty with ambulating,sitting, laying down or rolling side to side  Pt has issues with ambulation   Pt has no issues moving head neck or swallowing  No egg or soy allergy known to patient   No issues known to pt with past sedation with any surgeries or procedures  Pt denies having issues being intubated  No FH of Malignant Hyperthermia  Pt is not on diet pills or shots  Pt is not on home 02   Pt is not on blood thinners   Pt denies issues with constipation   Pt is not on dialysis  Pt denise any abnormal heart rhythms   Pt denies any upcoming cardiac testing  Pt encouraged to use to use Singlecare or Goodrx to reduce cost   Patient's chart reviewed by Cathlyn Parsons CNRA prior to pre-visit and patient appropriate for the [facility].  Pre-visit completed and red dot placed by patient's name on their procedure day (on provider's schedule).  .  Visit by phone   Pt states weight is 230 lb   Instructed pt why it is important to and  to call if they have any changes in health or new medications. Directed them to the # given and on instructions.     Instructions reviewed. Pt given both [facility] main # and MD on call # prior to instructions.  Pt states understanding. Instructed to review again prior to procedure. Pt states they will.   Instructions sent by mail with coupon and by My Chart  Instructions and coupon given to pt   Instructions sent through My Chart  Coupon sent via text to mobile phone and pt verified they received it

## 2023-11-29 ENCOUNTER — Encounter: Payer: Self-pay | Admitting: Family Medicine

## 2023-11-29 ENCOUNTER — Ambulatory Visit (INDEPENDENT_AMBULATORY_CARE_PROVIDER_SITE_OTHER): Payer: Managed Care, Other (non HMO) | Admitting: Family Medicine

## 2023-11-29 VITALS — BP 118/74 | HR 57 | Temp 98.6°F | Ht 69.0 in | Wt 242.0 lb

## 2023-11-29 DIAGNOSIS — Z0000 Encounter for general adult medical examination without abnormal findings: Secondary | ICD-10-CM | POA: Diagnosis not present

## 2023-11-29 LAB — CBC WITH DIFFERENTIAL/PLATELET
Basophils Absolute: 0 10*3/uL (ref 0.0–0.1)
Basophils Relative: 0.7 % (ref 0.0–3.0)
Eosinophils Absolute: 0.1 10*3/uL (ref 0.0–0.7)
Eosinophils Relative: 2 % (ref 0.0–5.0)
HCT: 52.9 % — ABNORMAL HIGH (ref 39.0–52.0)
Hemoglobin: 17.6 g/dL — ABNORMAL HIGH (ref 13.0–17.0)
Lymphocytes Relative: 26.4 % (ref 12.0–46.0)
Lymphs Abs: 1.7 10*3/uL (ref 0.7–4.0)
MCHC: 33.2 g/dL (ref 30.0–36.0)
MCV: 94.7 fL (ref 78.0–100.0)
Monocytes Absolute: 0.7 10*3/uL (ref 0.1–1.0)
Monocytes Relative: 10.7 % (ref 3.0–12.0)
Neutro Abs: 3.8 10*3/uL (ref 1.4–7.7)
Neutrophils Relative %: 60.2 % (ref 43.0–77.0)
Platelets: 245 10*3/uL (ref 150.0–400.0)
RBC: 5.59 Mil/uL (ref 4.22–5.81)
RDW: 13.1 % (ref 11.5–15.5)
WBC: 6.3 10*3/uL (ref 4.0–10.5)

## 2023-11-29 LAB — TSH: TSH: 2.87 u[IU]/mL (ref 0.35–5.50)

## 2023-11-29 LAB — HEPATIC FUNCTION PANEL
ALT: 32 U/L (ref 0–53)
AST: 31 U/L (ref 0–37)
Albumin: 4.1 g/dL (ref 3.5–5.2)
Alkaline Phosphatase: 60 U/L (ref 39–117)
Bilirubin, Direct: 0.2 mg/dL (ref 0.0–0.3)
Total Bilirubin: 0.9 mg/dL (ref 0.2–1.2)
Total Protein: 6.7 g/dL (ref 6.0–8.3)

## 2023-11-29 LAB — LIPID PANEL
Cholesterol: 149 mg/dL (ref 0–200)
HDL: 40.8 mg/dL (ref >39.00)
LDL Cholesterol: 88 mg/dL (ref 0–99)
NonHDL: 108.32
Total CHOL/HDL Ratio: 4
Triglycerides: 102 mg/dL (ref 0.0–149.0)
VLDL: 20.4 mg/dL (ref 0.0–40.0)

## 2023-11-29 LAB — BASIC METABOLIC PANEL
BUN: 16 mg/dL (ref 6–23)
CO2: 32 meq/L (ref 19–32)
Calcium: 9.3 mg/dL (ref 8.4–10.5)
Chloride: 103 meq/L (ref 96–112)
Creatinine, Ser: 0.99 mg/dL (ref 0.40–1.50)
GFR: 79.12 mL/min (ref >60.00)
Glucose, Bld: 95 mg/dL (ref 70–99)
Potassium: 5.1 meq/L (ref 3.5–5.1)
Sodium: 139 meq/L (ref 135–145)

## 2023-11-29 LAB — PSA: PSA: 0.33 ng/mL (ref 0.10–4.00)

## 2023-11-29 MED ORDER — SERTRALINE HCL 100 MG PO TABS: 100.0000 mg | ORAL_TABLET | Freq: Every day | ORAL | 3 refills | Status: DC

## 2023-11-29 MED ORDER — ATORVASTATIN CALCIUM 40 MG PO TABS: 40.0000 mg | ORAL_TABLET | Freq: Every day | ORAL | 3 refills | Status: DC

## 2023-11-29 NOTE — Progress Notes (Signed)
Subjective:    Patient ID: Nicholas Moore, male    DOB: 1956/05/28, 67 y.o.   MRN: [id number]  HPI Here for a well exam. He feels well in general.    Review of Systems  Constitutional: Negative.   HENT: Negative.    Eyes: Negative.   Respiratory: Negative.    Cardiovascular: Negative.   Gastrointestinal: Negative.   Genitourinary: Negative.   Musculoskeletal: Negative.   Skin: Negative.   Neurological: Negative.   Psychiatric/Behavioral: Negative.         Objective:   Physical Exam Constitutional:      General: He is not in acute distress.    Appearance: He is well-developed. He is obese. He is not diaphoretic.  HENT:     Head: Normocephalic and atraumatic.     Right Ear: External ear normal.     Left Ear: External ear normal.     Nose: Nose normal.     Mouth/Throat:     Pharynx: No oropharyngeal exudate.  Eyes:     General: No scleral icterus.       Right eye: No discharge.        Left eye: No discharge.     Conjunctiva/sclera: Conjunctivae normal.     Pupils: Pupils are equal, round, and reactive to light.  Neck:     Thyroid: No thyromegaly.     Vascular: No JVD.     Trachea: No tracheal deviation.  Cardiovascular:     Rate and Rhythm: Normal rate and regular rhythm.     Pulses: Normal pulses.     Heart sounds: Normal heart sounds. No murmur heard.    No friction rub. No gallop.  Pulmonary:     Effort: Pulmonary effort is normal. No respiratory distress.     Breath sounds: Normal breath sounds. No wheezing or rales.  Chest:     Chest wall: No tenderness.  Abdominal:     General: Bowel sounds are normal. There is no distension.     Palpations: Abdomen is soft. There is no mass.     Tenderness: There is no abdominal tenderness. There is no guarding or rebound.  Genitourinary:    Penis: Normal. No tenderness.      Testes: Normal.     Prostate: Normal.     Rectum: Normal. Guaiac result negative.  Musculoskeletal:        General: No tenderness. Normal  range of motion.     Cervical back: Neck supple.  Lymphadenopathy:     Cervical: No cervical adenopathy.  Skin:    General: Skin is warm and dry.     Coloration: Skin is not pale.     Findings: No erythema or rash.  Neurological:     General: No focal deficit present.     Mental Status: He is alert and oriented to person, place, and time.     Cranial Nerves: No cranial nerve deficit.     Motor: No abnormal muscle tone.     Coordination: Coordination normal.     Deep Tendon Reflexes: Reflexes are normal and symmetric. Reflexes normal.  Psychiatric:        Mood and Affect: Mood normal.        Behavior: Behavior normal.        Thought Content: Thought content normal.        Judgment: Judgment normal.           Assessment & Plan:  Well exam. We discussed diet and exercise. Get fasting  labs. He is scheduled for a colonoscopy on 02-03-24. Gershon Crane, MD

## 2023-11-30 ENCOUNTER — Encounter: Payer: Managed Care, Other (non HMO) | Admitting: Gastroenterology

## 2024-01-20 ENCOUNTER — Telehealth: Payer: Self-pay | Admitting: *Deleted

## 2024-01-20 DIAGNOSIS — Z860100 Personal history of colon polyps, unspecified: Secondary | ICD-10-CM

## 2024-01-20 MED ORDER — NA SULFATE-K SULFATE-MG SULF 17.5-3.13-1.6 GM/177ML PO SOLN: 1.0000 | Freq: Once | ORAL | 0 refills | Status: AC

## 2024-01-20 NOTE — Telephone Encounter (Signed)
Unable to reach patient. Left voicemail advising I would send Suprep rx to [facility], and updated instructions mailed and sent through MyChart.

## 2024-01-20 NOTE — Telephone Encounter (Signed)
Patient called and stated that his procedure is coming up due to him having to reschedule twice, but patient has not received his prep medication. Patient is requesting a call back. Please advise.

## 2024-01-26 ENCOUNTER — Encounter: Payer: Self-pay | Admitting: Gastroenterology

## 2024-02-03 ENCOUNTER — Ambulatory Visit (AMBULATORY_SURGERY_CENTER): Payer: Medicare Other | Admitting: Gastroenterology

## 2024-02-03 ENCOUNTER — Encounter: Payer: Self-pay | Admitting: Gastroenterology

## 2024-02-03 VITALS — BP 119/78 | HR 62 | Temp 99.0°F | Resp 21 | Ht 69.0 in | Wt 230.0 lb

## 2024-02-03 DIAGNOSIS — D122 Benign neoplasm of ascending colon: Secondary | ICD-10-CM

## 2024-02-03 DIAGNOSIS — Z860100 Personal history of colon polyps, unspecified: Secondary | ICD-10-CM | POA: Diagnosis not present

## 2024-02-03 DIAGNOSIS — E785 Hyperlipidemia, unspecified: Secondary | ICD-10-CM | POA: Diagnosis not present

## 2024-02-03 DIAGNOSIS — K5730 Diverticulosis of large intestine without perforation or abscess without bleeding: Secondary | ICD-10-CM | POA: Diagnosis not present

## 2024-02-03 DIAGNOSIS — K6389 Other specified diseases of intestine: Secondary | ICD-10-CM

## 2024-02-03 DIAGNOSIS — K562 Volvulus: Secondary | ICD-10-CM

## 2024-02-03 DIAGNOSIS — K648 Other hemorrhoids: Secondary | ICD-10-CM

## 2024-02-03 DIAGNOSIS — Z1211 Encounter for screening for malignant neoplasm of colon: Secondary | ICD-10-CM

## 2024-02-03 MED ORDER — SODIUM CHLORIDE 0.9 % IV SOLN: 500.0000 mL | Freq: Once | INTRAVENOUS | Status: DC

## 2024-02-03 NOTE — Progress Notes (Signed)
 [facility] History and Physical   Primary Care Physician:  Johnny Garnette LABOR, MD   Reason for Procedure:   History of colon polyps   Plan:    colonoscopy     HPI: Nicholas Moore is a 68 y.o. male  here for colonoscopy surveillance - 3 polyps removed 10/2018, adenoma.   Patient denies any bowel symptoms at this time. No family history of colon cancer known. Otherwise feels well without any cardiopulmonary symptoms.   I have discussed risks / benefits of anesthesia and endoscopic procedure with Nicholas Moore and they wish to proceed with the exams as outlined today.    Past Medical History:  Diagnosis Date   Anxiety    ED (erectile dysfunction)    GERD (gastroesophageal reflux disease)    diet controlled   History of hypotestosteronemia    sees Dr. 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 Elspeth Ore    Sleep apnea    sees Dr. Francis Dresser, wears CPAP nightly   Tubular adenoma of colon 05/2002    Past Surgical History:  Procedure Laterality Date   COLONOSCOPY  10/30/2018   per Dr. Aneita, adenomatous polyp, repeat in 5 yrs    KNEE ARTHROSCOPY Left    SEPTOPLASTY  2007   SKIN LESION EXCISION     sees Dr. Prentice Ivin  - left back   TONSILLECTOMY  2007   WISDOM TOOTH EXTRACTION      Prior to Admission medications   Medication Sig Start Date End Date Taking? Authorizing Provider  atorvastatin  (LIPITOR) 40 MG tablet Take 1 tablet (40 mg total) by mouth daily. 11/29/23  Yes Johnny Garnette LABOR, MD  Cholecalciferol (VITAMIN D3) 50 MCG (2000 UT) capsule Take 2,000 Units by mouth daily.   Yes [provider]  Multiple Vitamin (MULTIVITAMIN) capsule Take 1 capsule by mouth daily.   Yes [provider]  Omega-3 Fatty Acids (FISH OIL) 1000 MG CAPS Take by mouth.   Yes [provider]  OVER THE COUNTER MEDICATION Balance nature red and  green   Yes [provider]  OVER THE COUNTER MEDICATION DIM 200mg    Yes [provider]  sertraline  (ZOLOFT ) 100 MG tablet Take 1 tablet (100 mg total) by mouth daily. 11/29/23  Yes Johnny Garnette LABOR, MD    Current Outpatient Medications  Medication Sig Dispense Refill   atorvastatin  (LIPITOR) 40 MG tablet Take 1 tablet (40 mg total) by mouth daily. 90 tablet 3   Cholecalciferol (VITAMIN D3) 50 MCG (2000 UT) capsule Take 2,000 Units by mouth daily.     Multiple Vitamin (MULTIVITAMIN) capsule Take 1 capsule by mouth daily.     Omega-3 Fatty Acids (FISH OIL) 1000 MG CAPS Take by mouth.     OVER THE COUNTER MEDICATION Balance nature red and green     OVER THE COUNTER MEDICATION DIM 200mg      sertraline  (ZOLOFT ) 100 MG tablet Take 1 tablet (100 mg total) by mouth daily. 90 tablet 3   Current Facility-Administered Medications  Medication Dose Route Frequency Provider Last Rate Last Admin   0.9 %  sodium chloride  infusion  500 mL Intravenous Once Ladonne Sharples, Elspeth SQUIBB, MD        Allergies as of 02/03/2024   (No Known Allergies)    Family History  Problem Relation Age of Onset  Alcohol abuse Other    Coronary artery disease Other    Hypertension Other    Emphysema Other    Colon cancer Neg Hx    Pancreatic cancer Neg Hx    Stomach cancer Neg Hx    Rectal cancer Neg Hx    Colon polyps Neg Hx    Esophageal cancer Neg Hx     Social History   Socioeconomic History   Marital status: Married    Spouse name: Not on file   Number of children: Not on file   Years of education: Not on file   Highest education level: Bachelor's degree (e.g., BA, AB, BS)  Occupational History   Not on file  Tobacco Use   Smoking status: Never   Smokeless tobacco: Never  Vaping Use   Vaping status: Never Used  Substance and Sexual Activity   Alcohol use: Yes    Alcohol/week: 14.0 standard drinks of alcohol    Types: 14 Glasses of wine per week   Drug use: No   Sexual activity: Yes   Other Topics Concern   Not on file  Social History Narrative   Not on file   Social Drivers of Health   Financial Resource Strain: Low Risk  (11/25/2023)   Overall Financial Resource Strain (CARDIA)    Difficulty of Paying Living Expenses: Not hard at all  Food Insecurity: No Food Insecurity (11/25/2023)   Hunger Vital Sign    Worried About Running Out of Food in the Last Year: Never true    Ran Out of Food in the Last Year: Never true  Transportation Needs: No Transportation Needs (11/25/2023)   PRAPARE - Administrator, Civil Service (Medical): No    Lack of Transportation (Non-Medical): No  Physical Activity: Sufficiently Active (11/25/2023)   Exercise Vital Sign    Days of Exercise per Week: 3 days    Minutes of Exercise per Session: 80 min  Stress: No Stress Concern Present (11/25/2023)   [employer] of Occupational Health - Occupational Stress Questionnaire    Feeling of Stress : Only a little  Social Connections: Moderately Isolated (11/25/2023)   Social Connection and Isolation Panel [NHANES]    Frequency of Communication with Friends and Family: More than three times a week    Frequency of Social Gatherings with Friends and Family: Once a week    Attends Religious Services: Never    Database Administrator or Organizations: No    Attends Engineer, Structural: Not on file    Marital Status: Married  Catering Manager Violence: Not on file    Review of Systems: All other review of systems negative except as mentioned in the HPI.  Physical Exam: Vital signs BP 115/78   Pulse 70   Temp 99 F (37.2 C) (Temporal)   Ht 5' 9 (1.753 m)   Wt 230 lb (104.3 kg)   SpO2 95%   BMI 33.97 kg/m   General:   Alert,  Well-developed, pleasant and cooperative in NAD Lungs:  Clear throughout to auscultation.   Heart:  Regular rate and rhythm Abdomen:  Soft, nontender and nondistended.   Neuro/Psych:  Alert and cooperative. Normal mood and affect. A  and O x 3  Marcey Naval, MD [facility]

## 2024-02-03 NOTE — Progress Notes (Signed)
 Called to room to assist during endoscopic procedure.  Patient ID and intended procedure confirmed with present staff. Received instructions for my participation in the procedure from the performing physician.

## 2024-02-03 NOTE — Patient Instructions (Signed)

## 2024-02-03 NOTE — Op Note (Signed)
 [facility] Patient Name: Nicholas Moore Procedure Date: 02/03/2024 11:10 AM MRN: [id number] Endoscopist: Elspeth P. Leigh , MD, [id number] Age: 68 Referring MD:  Date of Birth: 1956/10/11 Gender: Male Account #: [id number] Procedure:                Colonoscopy Indications:              High risk colon cancer surveillance: Personal                            history of colonic polyps - 3 polyps removed                            10/2018 - Dr. Aneita, adenoma Medicines:                Monitored Anesthesia Care Procedure:                Pre-Anesthesia Assessment:                           - Prior to the procedure, a History and Physical                            was performed, and patient medications and                            allergies were reviewed. The patient's tolerance of                            previous anesthesia was also reviewed. The risks                            and benefits of the procedure and the sedation                            options and risks were discussed with the patient.                            All questions were answered, and informed consent                            was obtained. Prior Anticoagulants: The patient has                            taken no anticoagulant or antiplatelet agents. ASA                            Grade Assessment: II - A patient with mild systemic                            disease. After reviewing the risks and benefits,                            the patient was deemed in satisfactory condition to  undergo the procedure.                           After obtaining informed consent, the colonoscope                            was passed under direct vision. Throughout the                            procedure, the patient's blood pressure, pulse, and                            oxygen saturations were monitored continuously. The                            CF [id number] #[id number] was introduced  through the anus                            and advanced to the the cecum, identified by                            appendiceal orifice and ileocecal valve. The                            colonoscopy was technically difficult and complex                            due to significant looping. The patient tolerated                            the procedure well. The quality of the bowel                            preparation was good. The ileocecal valve,                            appendiceal orifice, and rectum were photographed. Scope In: 11:17:40 AM Scope Out: 11:38:55 AM Scope Withdrawal Time: 0 hours 9 minutes 11 seconds  Total Procedure Duration: 0 hours 21 minutes 15 seconds  Findings:                 The perianal and digital rectal examinations were                            normal.                           A 3 mm polyp was found in the ascending colon. The                            polyp was sessile. The polyp was removed with a                            cold snare. Resection and retrieval were complete.  A few small-mouthed diverticula were found in the                            sigmoid colon.                           The colon was long and redundant with significant                            looping. Abdominal pressure used to achieve cecal                            intubation.                           Internal hemorrhoids were found during retroflexion.                           The exam was otherwise without abnormality. Complications:            No immediate complications. Estimated blood loss:                            Minimal. Estimated Blood Loss:     Estimated blood loss was minimal. Impression:               - One 3 mm polyp in the ascending colon, removed                            with a cold snare. Resected and retrieved.                           - Diverticulosis in the sigmoid colon.                           - Redundant / long  colon with looping.                           - Internal hemorrhoids.                           - The examination was otherwise normal. Recommendation:           - Patient has a contact number available for                            emergencies. The signs and symptoms of potential                            delayed complications were discussed with the                            patient. Return to normal activities tomorrow.                            Written discharge instructions were provided to the  patient.                           - Resume previous diet.                           - Continue present medications.                           - Await pathology results. Elspeth P. Nuh Lipton, MD 02/03/2024 11:43:10 AM This report has been signed electronically.

## 2024-02-03 NOTE — Progress Notes (Signed)
 Vss nad trans to pacu

## 2024-02-06 ENCOUNTER — Telehealth: Payer: Self-pay

## 2024-02-06 NOTE — Telephone Encounter (Signed)
 Attempted f/u call. No answer, VM not setup.

## 2024-02-08 LAB — SURGICAL PATHOLOGY

## 2024-02-11 ENCOUNTER — Encounter: Payer: Self-pay | Admitting: Gastroenterology

## 2024-02-20 ENCOUNTER — Encounter: Payer: Self-pay | Admitting: Gastroenterology

## 2024-03-22 ENCOUNTER — Ambulatory Visit (INDEPENDENT_AMBULATORY_CARE_PROVIDER_SITE_OTHER): Admitting: Adult Health

## 2024-03-22 VITALS — BP 110/74 | HR 69 | Temp 98.1°F | Ht 69.0 in | Wt 237.0 lb

## 2024-03-22 DIAGNOSIS — R051 Acute cough: Secondary | ICD-10-CM

## 2024-03-22 DIAGNOSIS — J0140 Acute pansinusitis, unspecified: Secondary | ICD-10-CM

## 2024-03-22 MED ORDER — METHYLPREDNISOLONE 4 MG PO TBPK: ORAL_TABLET | ORAL | 0 refills | Status: DC

## 2024-03-22 NOTE — Progress Notes (Signed)
 Subjective:    Patient ID: Nicholas Moore, male    DOB: 1956-08-12, 68 y.o.   MRN: [id number]  HPI 68 year old male who  has a past medical history of Anxiety, ED (erectile dysfunction), GERD (gastroesophageal reflux disease), History of hypotestosteronemia, Hyperlipidemia, Melanoma (HCC), Pituitary adenoma with extrasellar extension [facility]), Sleep apnea, and Tubular adenoma of colon (05/2002).  He is a patient of Dr. Clent Ridges who I am seeing today for an acute issue.  He reports that his symptoms have been present for 6 days.  Symptoms include semi productive cough,rhinorrhea with discolored drainage, nasal congestion.  He denies fevers, chills, sore throat, wheezing, or shortness of breath.  His wife who is a nurse got him a Z-Pak and he started this yesterday.  He did test at home for COVID and flu and both of these were negative.   Review of Systems See HPI   Past Medical History:  Diagnosis Date   Anxiety    ED (erectile dysfunction)    GERD (gastroesophageal reflux disease)    diet controlled   History of hypotestosteronemia    sees Dr. Patsi Sears   Hyperlipidemia    Melanoma [facility])    dr Terri Piedra - left upper back   Pituitary adenoma with extrasellar extension [facility])    sees Dr. Laurene Footman    Sleep apnea    sees Dr. Marcelyn Bruins, wears CPAP nightly   Tubular adenoma of colon 05/2002    Social History   Socioeconomic History   Marital status: Married    Spouse name: Not on file   Number of children: Not on file   Years of education: Not on file   Highest education level: Bachelor's degree (e.g., BA, AB, BS)  Occupational History   Not on file  Tobacco Use   Smoking status: Never   Smokeless tobacco: Never  Vaping Use   Vaping status: Never Used  Substance and Sexual Activity   Alcohol use: Yes    Alcohol/week: 14.0 standard drinks of alcohol    Types: 14 Glasses of wine per week   Drug use: No   Sexual activity: Yes  Other Topics Concern   Not on file   Social History Narrative   Not on file   Social Drivers of Health   Financial Resource Strain: Low Risk  (11/25/2023)   Overall Financial Resource Strain (CARDIA)    Difficulty of Paying Living Expenses: Not hard at all  Food Insecurity: No Food Insecurity (11/25/2023)   Hunger Vital Sign    Worried About Running Out of Food in the Last Year: Never true    Ran Out of Food in the Last Year: Never true  Transportation Needs: No Transportation Needs (11/25/2023)   PRAPARE - Administrator, Civil Service (Medical): No    Lack of Transportation (Non-Medical): No  Physical Activity: Sufficiently Active (11/25/2023)   Exercise Vital Sign    Days of Exercise per Week: 3 days    Minutes of Exercise per Session: 80 min  Stress: No Stress Concern Present (11/25/2023)   [employer] of Occupational Health - Occupational Stress Questionnaire    Feeling of Stress : Only a little  Social Connections: Moderately Isolated (11/25/2023)   Social Connection and Isolation Panel [NHANES]    Frequency of Communication with Friends and Family: More than three times a week    Frequency of Social Gatherings with Friends and Family: Once a week    Attends Religious Services: Never  Active Member of Clubs or Organizations: No    Attends Banker Meetings: Not on file    Marital Status: Married  Intimate Partner Violence: Not on file    Past Surgical History:  Procedure Laterality Date   COLONOSCOPY  10/30/2018   per Dr. Russella Dar, adenomatous polyp, repeat in 5 yrs    KNEE ARTHROSCOPY Left    SEPTOPLASTY  2007   SKIN LESION EXCISION     sees Dr. Para Skeans  - left back   TONSILLECTOMY  2007   WISDOM TOOTH EXTRACTION      Family History  Problem Relation Age of Onset   Alcohol abuse Other    Coronary artery disease Other    Hypertension Other    Emphysema Other    Colon cancer Neg Hx    Pancreatic cancer Neg Hx    Stomach cancer Neg Hx    Rectal cancer Neg Hx     Colon polyps Neg Hx    Esophageal cancer Neg Hx     No Known Allergies  Current Outpatient Medications on File Prior to Visit  Medication Sig Dispense Refill   atorvastatin (LIPITOR) 40 MG tablet Take 1 tablet (40 mg total) by mouth daily. 90 tablet 3   Cholecalciferol (VITAMIN D3) 50 MCG (2000 UT) capsule Take 2,000 Units by mouth daily.     Multiple Vitamin (MULTIVITAMIN) capsule Take 1 capsule by mouth daily.     Omega-3 Fatty Acids (FISH OIL) 1000 MG CAPS Take by mouth.     OVER THE COUNTER MEDICATION Balance nature red and green     OVER THE COUNTER MEDICATION DIM 200mg      sertraline (ZOLOFT) 100 MG tablet Take 1 tablet (100 mg total) by mouth daily. 90 tablet 3   No current facility-administered medications on file prior to visit.    BP 110/74   Pulse 69   Temp 98.1 F (36.7 C) (Oral)   Ht 5\' 9"  (1.753 m)   Wt 237 lb (107.5 kg)   SpO2 97%   BMI 35.00 kg/m       Objective:   Physical Exam Vitals and nursing note reviewed.  Constitutional:      Appearance: Normal appearance.  HENT:     Right Ear: There is no impacted cerumen.     Left Ear: There is no impacted cerumen.     Nose: Congestion and rhinorrhea present. Rhinorrhea is purulent.     Right Turbinates: Enlarged and swollen.     Left Turbinates: Enlarged and swollen.     Mouth/Throat:     Pharynx: Oropharynx is clear.  Cardiovascular:     Rate and Rhythm: Normal rate and regular rhythm.     Pulses: Normal pulses.     Heart sounds: Normal heart sounds.  Pulmonary:     Effort: Pulmonary effort is normal.     Breath sounds: Normal breath sounds.  Skin:    General: Skin is warm and dry.  Neurological:     Mental Status: He is alert.  Psychiatric:        Mood and Affect: Mood normal.        Behavior: Behavior normal.        Thought Content: Thought content normal.        Judgment: Judgment normal.        Assessment & Plan:  1. Acute non-recurrent pansinusitis (Primary) - He can continue with  Z-pack  - Will add on medrol dose pack  - Follow up  if not resolved in the next 4-5 days   2. Acute cough  - methylPREDNISolone (MEDROL DOSEPAK) 4 MG TBPK tablet; Take as directed  Dispense: 21 tablet; Refill: 0  Shirline Frees, NP

## 2024-04-10 ENCOUNTER — Ambulatory Visit (INDEPENDENT_AMBULATORY_CARE_PROVIDER_SITE_OTHER): Admitting: Family Medicine

## 2024-04-10 ENCOUNTER — Encounter: Payer: Self-pay | Admitting: Family Medicine

## 2024-04-10 VITALS — BP 110/68 | HR 58 | Temp 97.9°F | Wt 237.0 lb

## 2024-04-10 DIAGNOSIS — E291 Testicular hypofunction: Secondary | ICD-10-CM | POA: Diagnosis not present

## 2024-04-10 NOTE — Progress Notes (Signed)
   Subjective:    Patient ID: Nicholas Moore, male    DOB: 1956-10-14, 68 y.o.   MRN: [id number]  HPI Here to discuss hormone replacement. He used to have testosterone implants placed by the [facility] clinic, but he stopped going there several years ago. Now he deals with fatigue, low libido, and erection issues.    Review of Systems  Constitutional:  Positive for fatigue.  Respiratory: Negative.    Cardiovascular: Negative.   Gastrointestinal: Negative.   Genitourinary: Negative.        Objective:   Physical Exam Constitutional:      Appearance: He is obese.  Cardiovascular:     Rate and Rhythm: Normal rate and regular rhythm.     Pulses: Normal pulses.     Heart sounds: Normal heart sounds.  Pulmonary:     Effort: Pulmonary effort is normal.     Breath sounds: Normal breath sounds.  Neurological:     Mental Status: He is alert.           Assessment & Plan:  Hypogonadism. We will check a testosterone level today. If this low, as we expected, he will start on injections. Corita Diego, MD

## 2024-04-11 LAB — TESTOSTERONE: Testosterone: 217.59 ng/dL — ABNORMAL LOW (ref 300.00–890.00)

## 2024-04-16 ENCOUNTER — Telehealth: Payer: Self-pay

## 2024-04-16 NOTE — Telephone Encounter (Signed)
 Copied from CRM [id number]. Topic: Clinical - Medication Question >> Apr 13, 2024  2:26 PM Ovid Blow wrote: Reason for CRM: Patient stated that he was calling about a testosterone  injection that Dr. Alyne Babinski recommended. Patient has not heard anything from anyone about this. Please contact patient regarding this matter

## 2024-04-17 MED ORDER — LUER LOCK SAFETY SYRINGES 22G X 1-1/2" 3 ML MISC: 1.0000 | 2 refills | Status: DC

## 2024-04-17 MED ORDER — TESTOSTERONE CYPIONATE 200 MG/ML IM SOLN: 200.0000 mg | INTRAMUSCULAR | 3 refills | Status: AC

## 2024-04-17 NOTE — Telephone Encounter (Signed)
 I sent in a RX for testosterone 

## 2024-04-17 NOTE — Telephone Encounter (Signed)
 FYI Pt lab results have not been reviewed, will contact pt when Dr Alyne Babinski reviews labs

## 2024-04-17 NOTE — Addendum Note (Signed)
 Addended by: Corita Diego A on: 04/17/2024 04:58 PM   Modules accepted: Orders

## 2024-04-18 NOTE — Telephone Encounter (Signed)
 Pt is aware.

## 2024-05-02 ENCOUNTER — Telehealth: Payer: Self-pay

## 2024-05-02 NOTE — Telephone Encounter (Signed)
 Copied from CRM [id number]. Topic: Clinical - Medical Advice >> May 02, 2024  4:01 PM Albertha Alosa wrote: Reason for CRM: Patient called in regarding weight loss, wanted to speak to a nurse about medications that he can be prescribed would like a callback about this   [phone]

## 2024-05-04 NOTE — Telephone Encounter (Signed)
 Spoke with pt advised to contact his insurance for recommendation on which GLP1 they will cover.Pt stated that he was using the GLP1 with [facility], stated that he will go back to [facility] for this.

## 2024-05-09 ENCOUNTER — Telehealth: Payer: Self-pay

## 2024-05-09 NOTE — Telephone Encounter (Signed)
 Patient called because he was informed by the nurse to call his insurance for Prior Authorization for weight loss medication. Patient called insurance but they would like to know which medication? Patient said that he is frustrated because he does not feel as though the office is providing him assistance with the process of inquiring the medication. Patient would like for Nicholas Moore to call him back with a name of possible medication.

## 2024-05-10 NOTE — Telephone Encounter (Signed)
 This message has been sent to Dr Alyne Babinski for approval

## 2024-05-14 MED ORDER — OZEMPIC (0.25 OR 0.5 MG/DOSE) 2 MG/3ML ~~LOC~~ SOPN: 0.2500 mg | PEN_INJECTOR | SUBCUTANEOUS | 5 refills | Status: DC

## 2024-05-14 NOTE — Telephone Encounter (Signed)
I sent in a RX for Ozempic

## 2024-05-14 NOTE — Addendum Note (Signed)
 Addended by: Corita Diego A on: 05/14/2024 12:47 PM   Modules accepted: Orders

## 2024-05-15 ENCOUNTER — Telehealth: Payer: Self-pay

## 2024-05-15 DIAGNOSIS — D225 Melanocytic nevi of trunk: Secondary | ICD-10-CM | POA: Diagnosis not present

## 2024-05-15 DIAGNOSIS — L821 Other seborrheic keratosis: Secondary | ICD-10-CM | POA: Diagnosis not present

## 2024-05-15 DIAGNOSIS — L814 Other melanin hyperpigmentation: Secondary | ICD-10-CM | POA: Diagnosis not present

## 2024-05-15 DIAGNOSIS — L820 Inflamed seborrheic keratosis: Secondary | ICD-10-CM | POA: Diagnosis not present

## 2024-05-15 DIAGNOSIS — D235 Other benign neoplasm of skin of trunk: Secondary | ICD-10-CM | POA: Diagnosis not present

## 2024-05-15 DIAGNOSIS — Z85820 Personal history of malignant melanoma of skin: Secondary | ICD-10-CM | POA: Diagnosis not present

## 2024-05-15 DIAGNOSIS — D485 Neoplasm of uncertain behavior of skin: Secondary | ICD-10-CM | POA: Diagnosis not present

## 2024-05-15 DIAGNOSIS — L578 Other skin changes due to chronic exposure to nonionizing radiation: Secondary | ICD-10-CM | POA: Diagnosis not present

## 2024-05-15 DIAGNOSIS — D0359 Melanoma in situ of other part of trunk: Secondary | ICD-10-CM | POA: Diagnosis not present

## 2024-05-15 NOTE — Telephone Encounter (Signed)

## 2024-05-16 ENCOUNTER — Telehealth: Payer: Self-pay

## 2024-05-16 ENCOUNTER — Other Ambulatory Visit (HOSPITAL_COMMUNITY): Payer: Self-pay

## 2024-05-16 MED ORDER — TIRZEPATIDE-WEIGHT MANAGEMENT 2.5 MG/0.5ML ~~LOC~~ SOLN: 2.5000 mg | SUBCUTANEOUS | 5 refills | Status: DC

## 2024-05-16 NOTE — Telephone Encounter (Signed)
 I cancelled Ozempic  and ordered Zepbound instead

## 2024-05-16 NOTE — Telephone Encounter (Signed)
 Pharmacy Patient Advocate Encounter   Received notification from Patient Pharmacy that prior authorization for Zepbound 2.5 is required/requested.   Insurance verification completed.   The patient is insured through [facility] .   Per test claim: patient has plan benefil exclusion.

## 2024-05-16 NOTE — Telephone Encounter (Signed)
 Left a detailed message with the information below at the patient's cell number.

## 2024-06-07 DIAGNOSIS — D0359 Melanoma in situ of other part of trunk: Secondary | ICD-10-CM | POA: Diagnosis not present

## 2024-06-07 DIAGNOSIS — D225 Melanocytic nevi of trunk: Secondary | ICD-10-CM | POA: Diagnosis not present

## 2024-06-07 DIAGNOSIS — L905 Scar conditions and fibrosis of skin: Secondary | ICD-10-CM | POA: Diagnosis not present

## 2024-06-14 ENCOUNTER — Telehealth: Payer: Self-pay | Admitting: Family Medicine

## 2024-06-14 DIAGNOSIS — I10 Essential (primary) hypertension: Secondary | ICD-10-CM

## 2024-06-14 DIAGNOSIS — E785 Hyperlipidemia, unspecified: Secondary | ICD-10-CM

## 2024-06-14 NOTE — Telephone Encounter (Signed)
 Copied from CRM [id number]. Topic: Referral - Prior Authorization Question >> Jun 14, 2024  2:27 PM [name] wrote: Reason for CRM: Patient calling to check status of his tirzepatide  (ZEPBOUND ) 2.5 MG/0.5ML injection vial, please reach out to patient.  Nicholas Moore [phone]

## 2024-06-15 NOTE — Telephone Encounter (Signed)
 Ant update on the status of his tirzepatide  (ZEPBOUND ) 2.5 MG/0.5ML injection vial?

## 2024-06-18 NOTE — Telephone Encounter (Signed)
 Pt insurance does not cover zepbound   please advise

## 2024-06-19 ENCOUNTER — Telehealth: Payer: Self-pay | Admitting: Family Medicine

## 2024-06-19 NOTE — Addendum Note (Signed)
 Addended by: JOHNNY SENIOR A on: 06/19/2024 01:13 PM   Modules accepted: Orders

## 2024-06-19 NOTE — Telephone Encounter (Signed)
 Copied from CRM [id number]. Topic: General - Other >> Jun 19, 2024  3:49 PM Shardie S wrote: Reason for CRM: Att: Inocente- Patient calling to inform you that [facility] do not accept Medicare. He thanks you for your effort in trying.

## 2024-06-19 NOTE — Telephone Encounter (Signed)
I did the referral to Nutrition

## 2024-06-19 NOTE — Telephone Encounter (Signed)
 Spoke with pt regarding denial of the GLP1 from his insurance, pt was advised to try seeing a nutritionist as per the appeal advise. Pt agrees to see nutritionist, please advise

## 2024-07-31 ENCOUNTER — Ambulatory Visit (INDEPENDENT_AMBULATORY_CARE_PROVIDER_SITE_OTHER): Admitting: Family Medicine

## 2024-07-31 ENCOUNTER — Encounter: Payer: Self-pay | Admitting: Family Medicine

## 2024-07-31 VITALS — BP 110/62 | HR 65 | Temp 98.2°F | Ht 69.0 in | Wt 240.0 lb

## 2024-07-31 DIAGNOSIS — E291 Testicular hypofunction: Secondary | ICD-10-CM | POA: Diagnosis not present

## 2024-07-31 NOTE — Progress Notes (Signed)
   Subjective:    Patient ID: Nicholas Moore, male    DOB: 03/12/1956, 68 y.o.   MRN: [id number]  HPI Here to follow up on low testosterone . On 04-10-24 we check ed a level and this was low at 217. He then started on shots every 14 days. Since then he feels better and has more energy.    Review of Systems  Constitutional: Negative.   Respiratory: Negative.    Cardiovascular: Negative.        Objective:   Physical Exam Constitutional:      Appearance: Normal appearance.  Cardiovascular:     Rate and Rhythm: Normal rate and regular rhythm.     Pulses: Normal pulses.     Heart sounds: Normal heart sounds.  Pulmonary:     Effort: Pulmonary effort is normal.     Breath sounds: Normal breath sounds.  Neurological:     Mental Status: He is alert.           Assessment & Plan:  Hypogonadism. We will check another testosterone  level today.  Garnette Olmsted, MD

## 2024-08-01 LAB — TESTOSTERONE: Testosterone: 425.3 ng/dL (ref 300.00–890.00)

## 2024-08-02 ENCOUNTER — Ambulatory Visit: Payer: Self-pay | Admitting: Family Medicine

## 2024-09-10 DIAGNOSIS — C44629 Squamous cell carcinoma of skin of left upper limb, including shoulder: Secondary | ICD-10-CM | POA: Diagnosis not present

## 2024-09-10 DIAGNOSIS — L821 Other seborrheic keratosis: Secondary | ICD-10-CM | POA: Diagnosis not present

## 2024-09-10 DIAGNOSIS — L814 Other melanin hyperpigmentation: Secondary | ICD-10-CM | POA: Diagnosis not present

## 2024-09-10 DIAGNOSIS — Z85820 Personal history of malignant melanoma of skin: Secondary | ICD-10-CM | POA: Diagnosis not present

## 2024-09-10 DIAGNOSIS — L578 Other skin changes due to chronic exposure to nonionizing radiation: Secondary | ICD-10-CM | POA: Diagnosis not present

## 2024-09-10 DIAGNOSIS — D485 Neoplasm of uncertain behavior of skin: Secondary | ICD-10-CM | POA: Diagnosis not present

## 2024-09-10 DIAGNOSIS — D225 Melanocytic nevi of trunk: Secondary | ICD-10-CM | POA: Diagnosis not present

## 2024-09-17 DIAGNOSIS — R29898 Other symptoms and signs involving the musculoskeletal system: Secondary | ICD-10-CM | POA: Diagnosis not present

## 2024-09-17 DIAGNOSIS — G8929 Other chronic pain: Secondary | ICD-10-CM | POA: Diagnosis not present

## 2024-09-17 DIAGNOSIS — M545 Low back pain, unspecified: Secondary | ICD-10-CM | POA: Diagnosis not present

## 2024-09-24 DIAGNOSIS — G8929 Other chronic pain: Secondary | ICD-10-CM | POA: Diagnosis not present

## 2024-09-24 DIAGNOSIS — R29898 Other symptoms and signs involving the musculoskeletal system: Secondary | ICD-10-CM | POA: Diagnosis not present

## 2024-09-24 DIAGNOSIS — M545 Low back pain, unspecified: Secondary | ICD-10-CM | POA: Diagnosis not present

## 2024-10-01 DIAGNOSIS — R29898 Other symptoms and signs involving the musculoskeletal system: Secondary | ICD-10-CM | POA: Diagnosis not present

## 2024-10-01 DIAGNOSIS — M545 Low back pain, unspecified: Secondary | ICD-10-CM | POA: Diagnosis not present

## 2024-10-01 DIAGNOSIS — G8929 Other chronic pain: Secondary | ICD-10-CM | POA: Diagnosis not present

## 2024-10-04 DIAGNOSIS — L905 Scar conditions and fibrosis of skin: Secondary | ICD-10-CM | POA: Diagnosis not present

## 2024-10-04 DIAGNOSIS — C44629 Squamous cell carcinoma of skin of left upper limb, including shoulder: Secondary | ICD-10-CM | POA: Diagnosis not present

## 2024-10-08 DIAGNOSIS — M545 Low back pain, unspecified: Secondary | ICD-10-CM | POA: Diagnosis not present

## 2024-10-08 DIAGNOSIS — R29898 Other symptoms and signs involving the musculoskeletal system: Secondary | ICD-10-CM | POA: Diagnosis not present

## 2024-10-08 DIAGNOSIS — G8929 Other chronic pain: Secondary | ICD-10-CM | POA: Diagnosis not present

## 2024-10-15 DIAGNOSIS — E559 Vitamin D deficiency, unspecified: Secondary | ICD-10-CM | POA: Diagnosis not present

## 2024-10-15 DIAGNOSIS — R29898 Other symptoms and signs involving the musculoskeletal system: Secondary | ICD-10-CM | POA: Diagnosis not present

## 2024-10-15 DIAGNOSIS — M545 Low back pain, unspecified: Secondary | ICD-10-CM | POA: Diagnosis not present

## 2024-10-15 DIAGNOSIS — G4733 Obstructive sleep apnea (adult) (pediatric): Secondary | ICD-10-CM | POA: Diagnosis not present

## 2024-10-15 DIAGNOSIS — G8929 Other chronic pain: Secondary | ICD-10-CM | POA: Diagnosis not present

## 2024-10-18 DIAGNOSIS — D235 Other benign neoplasm of skin of trunk: Secondary | ICD-10-CM | POA: Diagnosis not present

## 2024-10-18 DIAGNOSIS — D485 Neoplasm of uncertain behavior of skin: Secondary | ICD-10-CM | POA: Diagnosis not present

## 2024-10-22 DIAGNOSIS — G8929 Other chronic pain: Secondary | ICD-10-CM | POA: Diagnosis not present

## 2024-10-22 DIAGNOSIS — R29898 Other symptoms and signs involving the musculoskeletal system: Secondary | ICD-10-CM | POA: Diagnosis not present

## 2024-10-22 DIAGNOSIS — M545 Low back pain, unspecified: Secondary | ICD-10-CM | POA: Diagnosis not present

## 2024-10-22 DIAGNOSIS — G4733 Obstructive sleep apnea (adult) (pediatric): Secondary | ICD-10-CM | POA: Diagnosis not present

## 2024-11-20 ENCOUNTER — Encounter: Payer: Self-pay | Admitting: Family Medicine

## 2024-11-20 ENCOUNTER — Ambulatory Visit (INDEPENDENT_AMBULATORY_CARE_PROVIDER_SITE_OTHER): Admitting: Family Medicine

## 2024-11-20 ENCOUNTER — Other Ambulatory Visit: Payer: Self-pay | Admitting: Family Medicine

## 2024-11-20 DIAGNOSIS — Z0000 Encounter for general adult medical examination without abnormal findings: Secondary | ICD-10-CM

## 2024-11-20 NOTE — Progress Notes (Signed)
 Because this visit was a virtual/telehealth visit, some criteria may be missing or patient reported. Any vitals not documented were not able to be obtained and vitals that have been documented are patient reported.    MEDICARE ANNUAL PREVENTIVE CARE VISIT WITH PROVIDER (Welcome to Medicare, initial annual wellness or annual wellness exam)  Virtual Visit via Video Note  I connected with Nicholas Moore on 11/20/24  by a video enabled telemedicine application and verified that I am speaking with the correct person using two identifiers. For some reason we could only get one way video to work for his device.   Location patient: home Location provider:work or home office Persons participating in the virtual visit: patient, provider  Concerns and/or follow up today: detailed intake and health/risks assessment completed on flow sheets and below- please see for details. He would like to lose some weight. Wt loss medications did not work out.   How often do you have a drink containing alcohol?4 times per week in the past but recently quit a few weeks ago How many drinks containing alcohol do you have on a typical day when you are drinking?3-4 How often do you have six or more drinks on one occasion?never Have you ever smoked?n Quit date if applicable? na  How many packs a day do/did you smoke? na Do you use smokeless tobacco?na Do you use an illicit drugs?n Do you feel safe at home?n Last dentist visit?goes on regular basis Last eye Exam and location?Dr. Cleotilde, goes once a year   See HM section in Epic for other details of completed HM.    ROS: negative for report of fevers, unintentional weight loss, vision changes, vision loss, hearing loss or change, chest pain, sob, hemoptysis, melena, hematochezia, hematuria,bleeding or bruising  Patient-completed extensive health risk assessment - reviewed and discussed with the patient: See Health Risk Assessment completed with patient prior to the  visit either above or in recent phone note. This was reviewed in detailed with the patient today and appropriate recommendations, orders and referrals were placed as needed per Summary below and patient instructions.   Review of Medical History: -PMH, PSH, Family History and current specialty and care providers reviewed and updated and listed below   Patient Care Team: Johnny Garnette LABOR, MD as PCP - General   Past Medical History:  Diagnosis Date   Anxiety    ED (erectile dysfunction)    GERD (gastroesophageal reflux disease)    diet controlled   History of hypotestosteronemia    sees Dr. 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 Elspeth Ore    Sleep apnea    sees Dr. Francis Dresser, wears CPAP nightly   Tubular adenoma of colon 05/2002    Past Surgical History:  Procedure Laterality Date   COLONOSCOPY  10/30/2018   per Dr. Aneita, adenomatous polyp, repeat in 5 yrs    KNEE ARTHROSCOPY Left    SEPTOPLASTY  2007   SKIN LESION EXCISION     sees Dr. Prentice Ivin  - left back   TONSILLECTOMY  2007   WISDOM TOOTH EXTRACTION      Social History   Socioeconomic History   Marital status: Married    Spouse name: Not on file   Number of children: Not on file   Years of education: Not on file   Highest education level:  Bachelor's degree (e.g., BA, AB, BS)  Occupational History   Not on file  Tobacco Use   Smoking status: Never   Smokeless tobacco: Never  Vaping Use   Vaping status: Never Used  Substance and Sexual Activity   Alcohol use: Yes    Alcohol/week: 14.0 standard drinks of alcohol    Types: 14 Glasses of wine per week   Drug use: No   Sexual activity: Yes  Other Topics Concern   Not on file  Social History Narrative   Not on file   Social Drivers of Health   Financial Resource Strain: Low Risk  (11/16/2024)   Overall Financial Resource Strain (CARDIA)     Difficulty of Paying Living Expenses: Not hard at all  Food Insecurity: No Food Insecurity (11/20/2024)   Hunger Vital Sign    Worried About Running Out of Food in the Last Year: Never true    Ran Out of Food in the Last Year: Never true  Transportation Needs: No Transportation Needs (11/20/2024)   PRAPARE - Administrator, Civil Service (Medical): No    Lack of Transportation (Non-Medical): No  Physical Activity: Insufficiently Active (11/20/2024)   Exercise Vital Sign    Days of Exercise per Week: 1 day    Minutes of Exercise per Session: 60 min  Stress: No Stress Concern Present (11/20/2024)   [employer] of Occupational Health - Occupational Stress Questionnaire    Feeling of Stress: Not at all  Social Connections: Moderately Isolated (11/20/2024)   Social Connection and Isolation Panel    Frequency of Communication with Friends and Family: Once a week    Frequency of Social Gatherings with Friends and Family: Once a week    Attends Religious Services: 1 to 4 times per year    Active Member of [employer] or Organizations: No    Attends Engineer, Structural: Not on file    Marital Status: Married  Catering Manager Violence: Not on file    Family History  Problem Relation Age of Onset   Alcohol abuse Other    Coronary artery disease Other    Hypertension Other    Emphysema Other    Colon cancer Neg Hx    Pancreatic cancer Neg Hx    Stomach cancer Neg Hx    Rectal cancer Neg Hx    Colon polyps Neg Hx    Esophageal cancer Neg Hx     Current Outpatient Medications on File Prior to Visit  Medication Sig Dispense Refill   atorvastatin  (LIPITOR) 40 MG tablet Take 1 tablet (40 mg total) by mouth daily. 90 tablet 3   Cholecalciferol (VITAMIN D3) 50 MCG (2000 UT) capsule Take 2,000 Units by mouth daily.     Multiple Vitamin (MULTIVITAMIN) capsule Take 1 capsule by mouth daily.     Omega-3 Fatty Acids (FISH OIL) 1000 MG CAPS Take by mouth.     OVER THE  COUNTER MEDICATION Balance nature red and green     sertraline  (ZOLOFT ) 100 MG tablet Take 1 tablet (100 mg total) by mouth daily. 90 tablet 3   testosterone  cypionate (DEPOTESTOSTERONE CYPIONATE) 200 MG/ML injection Inject 1 mL (200 mg total) into the muscle every 14 (fourteen) days. 10 mL 3   No current facility-administered medications on file prior to visit.    No Known Allergies     Physical Exam Vitals requested from patient and listed below if patient had equipment and was able to obtain at home for this virtual  visit: There were no vitals filed for this visit. Estimated body mass index is 35.44 kg/m as calculated from the following:   Height as of 07/31/24: 5' 9 (1.753 m).   Weight as of 07/31/24: 240 lb (108.9 kg).  EKG (optional): deferred due to virtual visit  GENERAL: alert, oriented, no acute distress detected; full vision exam deferred due to pandemic and/or virtual encounter  PSYCH/NEURO: pleasant and cooperative, no obvious depression or anxiety, speech and thought processing grossly intact, Cognitive function grossly intact  Flowsheet Row Office Visit from 07/31/2024 in [facility] [place]  PHQ-9 Total Score 1        11/20/2024    9:44 AM 07/31/2024    4:18 PM 11/26/2022   11:00 AM 06/01/2022    2:05 PM 07/03/2021    1:33 PM  Depression screen PHQ 2/9  Decreased Interest 1 0 0 0 0  Down, Depressed, Hopeless 0 0 0 0 0  PHQ - 2 Score 1 0 0 0 0  Altered sleeping  0 1 0 0  Tired, decreased energy  0 1 0 1  Change in appetite  0 0 0 0  Feeling bad or failure about yourself   0 0 0 0  Trouble concentrating  1 0 0 0  Moving slowly or fidgety/restless  0 0 0 0  Suicidal thoughts  0 0 0 0  PHQ-9 Score  1  2  0  1   Difficult doing work/chores  Not difficult at all Not difficult at all Not difficult at all Not difficult at all     Data saved with a previous flowsheet row definition       07/28/2024   11:33 AM 07/31/2024    4:18 PM 11/16/2024     5:39 PM 11/20/2024    9:45 AM 11/20/2024   10:04 AM  Fall Risk  Falls in the past year? 0 0 0 0   Was there an injury with Fall?  0 0 0   Fall Risk Category Calculator  0 0  0   Patient at Risk for Falls Due to  No Fall Risks  No Fall Risks No Fall Risks  Fall risk Follow up  Falls evaluation completed  Falls evaluation completed Falls evaluation completed     Patient-reported     SUMMARY AND PLAN:  Encounter for Medicare annual wellness exam   Discussed applicable health maintenance/preventive health measures and advised and referred or ordered per patient preferences: -can get vaccines at the pharmacy -can get hep c screening when does labs Health Maintenance  Topic Date Due   Hepatitis C Screening  Never done   COVID-19 Vaccine (4 - 2025-26 season) 12/06/2024 (Originally 08/27/2024)   Influenza Vaccine  03/26/2025 (Originally 07/27/2024)   Medicare Annual Wellness (AWV)  11/20/2025   DTaP/Tdap/Td (2 - Td or Tdap) 01/20/2027   Colonoscopy  02/02/2034   Pneumococcal Vaccine: 50+ Years  Completed   Meningococcal B Vaccine  Aged Out   Zoster Vaccines- Shingrix   Discontinued     Education and counseling on the following was provided based on the above review of health and a plan/checklist for the patient, along with additional information discussed, was provided for the patient in the patient instructions :   -Advised and counseled on a healthy lifestyle - including the importance of a healthy diet, regular physical activity, social connections and stress management. -Reviewed patient's current diet. Advised and counseled extensively on a whole foods based healthy diet to  help heal metabolism. Advised reducing foods with added sweetener and ultraprocesed foods and replacing with whole food choices. Discussed replacements and alternatives for each food he is currently consuming that is ultraprocessed. Discussed ways to spot and avoid ultraprocessed sweets and grains.encourage to  increase veggies to 3+ servings per day and to include 1-2 servings of berries per day. Discussed healthy proteins and healthy fats.  A summary of a healthy diet was provided in the Patient Instructions.  -reviewed patient's current physical activity level and discussed exercise guidelines for adults. Discussed community resources and ideas for safe exercise at home to assist in meeting exercise guideline recommendations in a safe and healthy way.  -Advise yearly dental visits at minimum and regular eye exams -Advised and counseled on alcohol safe limits, risks - congratulated on giving up alcohol  Follow up: see patient instructions   Patient Instructions  I really enjoyed getting to talk with you today! I am available on Tuesdays and Thursdays for virtual visits if you have any questions or concerns, or if I can be of any further assistance.   CHECKLIST FROM ANNUAL WELLNESS VISIT:  -Follow up (please call to schedule if not scheduled after visit):   -yearly for annual wellness visit with primary care office  Here is a list of your preventive care/health maintenance measures and the plan for each if any are due:  PLAN For any measures below that may be due:    1. Can get vaccines at the pharmacy, please let us  know if you do so that we can update records.   2. Can get hepatitis C screening at the office the next time you get labs.  Health Maintenance  Topic Date Due   Hepatitis C Screening  Never done   COVID-19 Vaccine (4 - 2025-26 season) 12/06/2024 (Originally 08/27/2024)   Influenza Vaccine  03/26/2025 (Originally 07/27/2024)   Medicare Annual Wellness (AWV)  11/20/2025   DTaP/Tdap/Td (2 - Td or Tdap) 01/20/2027   Colonoscopy  02/02/2034   Pneumococcal Vaccine: 50+ Years  Completed   Meningococcal B Vaccine  Aged Out   Zoster Vaccines- Shingrix   Discontinued    -See a dentist at least yearly  -Get your eyes checked and then per your eye specialist's recommendations  -Other  issues addressed today:    -please bring a copy of  advanced directives to doctor Johnny or complete - see info below.   -I have included below further information regarding a healthy whole foods based diet, physical activity guidelines for adults, stress management and opportunities for social connections. I hope you find this information useful.   -----------------------------------------------------------------------------------------------------------------------------------------------------------------------------------------------------------------------------------------------------------    NUTRITION: -eat real food: lots of colorful vegetables (half the plate) and fruits -5-7 servings of vegetables and fruits per day (fresh or steamed is best), exp. 2 servings of vegetables with lunch and dinner and 2 servings of fruit per day. Berries and greens such as kale and collards are great choices.  -consume on a regular basis:  fresh fruits, fresh veggies, fish, nuts, seeds, healthy oils (such as olive oil, avocado oil), whole grains (make sure for bread/pasta/crackers/etc., that the first ingredient on label contains the word whole), legumes. -can eat small amounts of dairy and lean meat (no larger than the palm of your hand), but avoid processed meats such as ham, bacon, lunch meat, etc. -drink water -try to avoid fast food and pre-packaged foods, processed meat, ultra processed foods/beverages (donuts, candy, etc.) -most experts advise limiting sodium to < 2300mg  per day,  should limit further is any chronic conditions such as high blood pressure, heart disease, diabetes, etc. The American Heart Association advised that < 1500mg  is is ideal -try to avoid foods/beverages that contain any ingredients with names you do not recognize  -try to avoid foods/beverages  with added sugar or sweeteners/sweets  -try to avoid sweet drinks (including diet drinks): soda, juice, Gatorade, sweet tea, power  drinks, diet drinks -try to avoid white rice, white bread, pasta (unless whole grain)  EXERCISE GUIDELINES FOR ADULTS: -if you wish to increase your physical activity, do so gradually and with the approval of your doctor -STOP and seek medical care immediately if you have any chest pain, chest discomfort or trouble breathing when starting or increasing exercise  -move and stretch your body, legs, feet and arms when sitting for long periods -Physical activity guidelines for optimal health in adults: -get at least 150 minutes per week of moderate exercise (can talk, but not sing); this is about 20-30 minutes of sustained activity 5-7 days per week or two 10-15 minute episodes of sustained activity 5-7 days per week -do some muscle building/resistance training/strength training at least 2 days per week  -balance exercises 3+ days per week:   Stand somewhere where you have something sturdy to hold onto if you lose balance    1) lift up on toes, then back down, start with 5x per day and work up to 20x   2) stand and lift one leg straight out to the side so that foot is a few inches of the floor, start with 5x each side and work up to 20x each side   3) stand on one foot, start with 5 seconds each side and work up to 20 seconds on each side  If you need ideas or help with getting more active:  -Silver sneakers https://tools.silversneakers.com  -Walk with a Doc: Http://www.duncan-williams.com/  -try to include resistance (weight lifting/strength building) and balance exercises twice per week: or the following link for ideas: http://castillo-powell.com/  buyducts.dk  STRESS MANAGEMENT: -can try meditating, or just sitting quietly with deep breathing while intentionally relaxing all parts of your body for 5 minutes daily -if you need further help with stress, anxiety or depression please follow up with your  primary doctor or contact the wonderful folks at [facility]: [phone]  SOCIAL CONNECTIONS: -options in [place] if you wish to engage in more social and exercise related activities:  -Silver sneakers https://tools.silversneakers.com  -Walk with a Doc: Http://www.duncan-williams.com/  -Check out the [facility] Active Adults 50+ section on the [place] [employer] (hiking clubs, book clubs, cards and games, chess, exercise classes, aquatic classes and much more) - see the website for details: https://www.[place].gov/departments/parks-recreation/active-adults50  -YouTube has lots of exercise videos for different ages and abilities as well  -[facility] (a variety of indoor and outdoor inperson activities for adults). [phone]. [address]  -Virtual Online Classes (a variety of topics): see seniorplanet.org or call [phone]  -consider volunteering at a school, hospice center, church, senior center or elsewhere     ADVANCED HEALTHCARE DIRECTIVES:  Standard Advanced Directives assistance:   expressweek.com.cy  Everyone should have advanced health care directives in place. This is so that you get the care you want, should you ever be in a situation where you are unable to make your own medical decisions.   From the [place] Advanced Directive Website: Advance Health Care Directives are legal documents in which you give written instructions about your health care  if, in the future, you cannot speak for yourself.   A health care power of attorney allows you to name a person you trust to make your health care decisions if you cannot make them yourself. A declaration of a desire for a natural death (or living will) is document, which states that you desire not to have your life prolonged by extraordinary measures if you have a terminal or incurable illness or if you are in a vegetative  state. An advance instruction for mental health treatment makes a declaration of instructions, information and preferences regarding your mental health treatment. It also states that you are aware that the advance instruction authorizes a mental health treatment provider to act according to your wishes. It may also outline your consent or refusal of mental health treatment. A declaration of an anatomical gift allows anyone over the age of 52 to make a gift by will, organ donor card or other document.   Please see the following website or an elder law attorney for forms, FAQs and for completion of advanced directives: [place]  Print Production Planner Health Care Directives Advance Health Care Directives (http://guzman.com/)  Or copy and paste the following to your web browser: [email or web address]         Chiquita JONELLE Cramp, DO

## 2024-11-20 NOTE — Patient Instructions (Addendum)
 I really enjoyed getting to talk with you today! I am available on Tuesdays and Thursdays for virtual visits if you have any questions or concerns, or if I can be of any further assistance.   CHECKLIST FROM ANNUAL WELLNESS VISIT:  -Follow up (please call to schedule if not scheduled after visit):   -yearly for annual wellness visit with primary care office  Here is a list of your preventive care/health maintenance measures and the plan for each if any are due:  PLAN For any measures below that may be due:    1. Can get vaccines at the pharmacy, please let us  know if you do so that we can update records.   2. Can get hepatitis C screening at the office the next time you get labs.  Health Maintenance  Topic Date Due   Hepatitis C Screening  Never done   COVID-19 Vaccine (4 - 2025-26 season) 12/06/2024 (Originally 08/27/2024)   Influenza Vaccine  03/26/2025 (Originally 07/27/2024)   Medicare Annual Wellness (AWV)  11/20/2025   DTaP/Tdap/Td (2 - Td or Tdap) 01/20/2027   Colonoscopy  02/02/2034   Pneumococcal Vaccine: 50+ Years  Completed   Meningococcal B Vaccine  Aged Out   Zoster Vaccines- Shingrix   Discontinued    -See a dentist at least yearly  -Get your eyes checked and then per your eye specialist's recommendations  -Other issues addressed today:    -please bring a copy of  advanced directives to doctor Johnny or complete - see info below.   -I have included below further information regarding a healthy whole foods based diet, physical activity guidelines for adults, stress management and opportunities for social connections. I hope you find this information useful.   -----------------------------------------------------------------------------------------------------------------------------------------------------------------------------------------------------------------------------------------------------------    NUTRITION: -eat real food: lots of colorful vegetables (half  the plate) and fruits -5-7 servings of vegetables and fruits per day (fresh or steamed is best), exp. 2 servings of vegetables with lunch and dinner and 2 servings of fruit per day. Berries and greens such as kale and collards are great choices.  -consume on a regular basis:  fresh fruits, fresh veggies, fish, nuts, seeds, healthy oils (such as olive oil, avocado oil), whole grains (make sure for bread/pasta/crackers/etc., that the first ingredient on label contains the word whole), legumes. -can eat small amounts of dairy and lean meat (no larger than the palm of your hand), but avoid processed meats such as ham, bacon, lunch meat, etc. -drink water -try to avoid fast food and pre-packaged foods, processed meat, ultra processed foods/beverages (donuts, candy, etc.) -most experts advise limiting sodium to < 2300mg  per day, should limit further is any chronic conditions such as high blood pressure, heart disease, diabetes, etc. The American Heart Association advised that < 1500mg  is is ideal -try to avoid foods/beverages that contain any ingredients with names you do not recognize  -try to avoid foods/beverages  with added sugar or sweeteners/sweets  -try to avoid sweet drinks (including diet drinks): soda, juice, Gatorade, sweet tea, power drinks, diet drinks -try to avoid white rice, white bread, pasta (unless whole grain)  EXERCISE GUIDELINES FOR ADULTS: -if you wish to increase your physical activity, do so gradually and with the approval of your doctor -STOP and seek medical care immediately if you have any chest pain, chest discomfort or trouble breathing when starting or increasing exercise  -move and stretch your body, legs, feet and arms when sitting for long periods -Physical activity guidelines for optimal health in adults: -get at  least 150 minutes per week of moderate exercise (can talk, but not sing); this is about 20-30 minutes of sustained activity 5-7 days per week or two 10-15  minute episodes of sustained activity 5-7 days per week -do some muscle building/resistance training/strength training at least 2 days per week  -balance exercises 3+ days per week:   Stand somewhere where you have something sturdy to hold onto if you lose balance    1) lift up on toes, then back down, start with 5x per day and work up to 20x   2) stand and lift one leg straight out to the side so that foot is a few inches of the floor, start with 5x each side and work up to 20x each side   3) stand on one foot, start with 5 seconds each side and work up to 20 seconds on each side  If you need ideas or help with getting more active:  -Silver sneakers https://tools.silversneakers.com  -Walk with a Doc: Http://www.duncan-williams.com/  -try to include resistance (weight lifting/strength building) and balance exercises twice per week: or the following link for ideas: http://castillo-powell.com/  buyducts.dk  STRESS MANAGEMENT: -can try meditating, or just sitting quietly with deep breathing while intentionally relaxing all parts of your body for 5 minutes daily -if you need further help with stress, anxiety or depression please follow up with your primary doctor or contact the wonderful folks at [facility]: [phone]  SOCIAL CONNECTIONS: -options in [place] if you wish to engage in more social and exercise related activities:  -Silver sneakers https://tools.silversneakers.com  -Walk with a Doc: Http://www.duncan-williams.com/  -Check out the [facility] Active Adults 50+ section on the [place] of [employer] companies (hiking clubs, book clubs, cards and games, chess, exercise classes, aquatic classes and much more) - see the website for details: https://www.[place].gov/departments/parks-recreation/active-adults50  -YouTube has lots of exercise videos for different ages and abilities as  well  -[facility] (a variety of indoor and outdoor inperson activities for adults). [phone]. [address]  -Virtual Online Classes (a variety of topics): see seniorplanet.org or call [phone]  -consider volunteering at a school, hospice center, church, senior center or elsewhere     ADVANCED HEALTHCARE DIRECTIVES:  [place] Advanced Directives assistance:   expressweek.com.cy  Everyone should have advanced health care directives in place. This is so that you get the care you want, should you ever be in a situation where you are unable to make your own medical decisions.   From the [place] Advanced Directive Website: Advance Health Care Directives are legal documents in which you give written instructions about your health care if, in the future, you cannot speak for yourself.   A health care power of attorney allows you to name a person you trust to make your health care decisions if you cannot make them yourself. A declaration of a desire for a natural death (or living will) is document, which states that you desire not to have your life prolonged by extraordinary measures if you have a terminal or incurable illness or if you are in a vegetative state. An advance instruction for mental health treatment makes a declaration of instructions, information and preferences regarding your mental health treatment. It also states that you are aware that the advance instruction authorizes a mental health treatment provider to act according to your wishes. It may also outline your consent or refusal of mental health treatment. A declaration of an anatomical gift allows anyone over the age of 16 to make  a gift by will, organ donor card or other document.   Please see the following website or an elder law attorney for forms, FAQs and for completion of advanced directives: [place]  [employer]anner Health  Care Directives Advance Health Care Directives (http://guzman.[email or web address]/)  Or copy and paste the following to your web browser: [email or web address]

## 2025-01-14 ENCOUNTER — Encounter: Payer: Self-pay | Admitting: Family Medicine

## 2025-01-14 ENCOUNTER — Ambulatory Visit: Admitting: Family Medicine

## 2025-01-14 NOTE — Progress Notes (Signed)
" ° °  Subjective:    Patient ID: Nicholas Moore, male    DOB: 02/24/1956, 69 y.o.   MRN: [id number]  HPI Here asking for a referral to a weight management clinic. He has tried diets and exercise over the years, but he has never been successful at losing weight. We looked into the GLP-1 medications, but his insurance will not cover them. He asks if anything else can help. His BMI is 35 and he has sleep apnea and HTN.    Review of Systems  Constitutional: Negative.   Respiratory: Negative.    Cardiovascular: Negative.        Objective:   Physical Exam Constitutional:      Appearance: He is obese.  Cardiovascular:     Rate and Rhythm: Normal rate and regular rhythm.     Pulses: Normal pulses.     Heart sounds: Normal heart sounds.  Pulmonary:     Effort: Pulmonary effort is normal.     Breath sounds: Normal breath sounds.  Neurological:     Mental Status: He is alert.           Assessment & Plan:  Morbid obesity. We will refer him to the Weight Management clinic. He uses a CPAP at night for his sleep apnea. His HTN is stable. Nicholas Olmsted, MD   "

## 2025-01-15 ENCOUNTER — Encounter (INDEPENDENT_AMBULATORY_CARE_PROVIDER_SITE_OTHER): Payer: Self-pay

## 2025-01-23 ENCOUNTER — Other Ambulatory Visit: Payer: Self-pay | Admitting: Family Medicine

## 2025-01-29 DIAGNOSIS — Z0289 Encounter for other administrative examinations: Secondary | ICD-10-CM

## 2025-01-30 ENCOUNTER — Encounter (INDEPENDENT_AMBULATORY_CARE_PROVIDER_SITE_OTHER): Payer: Self-pay | Admitting: Family Medicine

## 2025-01-30 ENCOUNTER — Ambulatory Visit (INDEPENDENT_AMBULATORY_CARE_PROVIDER_SITE_OTHER): Admitting: Family Medicine

## 2025-01-30 VITALS — BP 131/72 | HR 63 | Temp 98.2°F | Ht 68.5 in | Wt 236.0 lb

## 2025-01-30 DIAGNOSIS — Z6835 Body mass index (BMI) 35.0-35.9, adult: Secondary | ICD-10-CM

## 2025-01-30 DIAGNOSIS — E785 Hyperlipidemia, unspecified: Secondary | ICD-10-CM

## 2025-01-30 DIAGNOSIS — G4730 Sleep apnea, unspecified: Secondary | ICD-10-CM

## 2025-01-30 NOTE — Progress Notes (Signed)
 " Nicholas DOROTHA Jenkins, DO, ABFM, ABOM Bariatric physician [address] [place], [place], [place] [address] Office: [phone]  /  Fax: [phone]     Initial Evaluation:  Nicholas Moore was seen in clinic today to evaluate for obesity. He is interested in losing weight to improve overall health and reduce the risk of weight related complications. He presents today to review program treatment options, initial physical assessment, and evaluation.      He was referred by: PCP  When asked what they hope to accomplish? He states: improve existing medical conditions and improve quality of life.   When asked how has your weight affected you? He states: Contributed to medical problems, Having poor endurance, and Other: problems with depression and/or anxiety.  Contributing factors to his weight change: family history of obesity, moderate to high levels of stress, reduced physical activity, and problems with eating patterns.  Some associated conditions: Hyperlipidemia, OSA, and Pre-DM -Pt was not aware he was pre-diabetic, but upon reviewing chart his A1c was 5.8 2 years ago.  Current nutrition plan: Other: Using YUKA app and eating healthier.   Current level of physical activity: None  Current or previous pharmacotherapy: GLP-1 - Tried a GLP-1 through [facility] for about 3 months; discontinued because he plateaued at 220 lbs.    Response to medication: Lost weight and then plateaued at 220 lbs.     Past Medical History:  Diagnosis Date   Anxiety    ED (erectile dysfunction)    GERD (gastroesophageal reflux disease)    diet controlled   History of hypotestosteronemia    sees Dr. 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 Elspeth Ore    Sleep apnea    sees Dr. Francis Dresser, wears CPAP nightly   Tubular adenoma of colon 05/2002    Current Outpatient Medications  Medication  Instructions   atorvastatin  (LIPITOR) 40 mg, Oral, Daily   Multiple Vitamin (MULTIVITAMIN) capsule 1 capsule, Daily   Omega-3 Fatty Acids (FISH OIL) 1000 MG CAPS Take by mouth.   OVER THE COUNTER MEDICATION Balance nature red and green   sertraline  (ZOLOFT ) 100 mg, Oral, Daily   testosterone  cypionate (DEPOTESTOSTERONE CYPIONATE) 200 mg, Intramuscular, Every 14 days   Vitamin D3 2,000 Units, Daily     Allergies[1]   Past Surgical History:  Procedure Laterality Date   COLONOSCOPY  10/30/2018   per Dr. Aneita, adenomatous polyp, repeat in 5 yrs    KNEE ARTHROSCOPY Left    SEPTOPLASTY  2007   SKIN LESION EXCISION     sees Dr. Prentice Ivin  - left back   TONSILLECTOMY  2007   WISDOM TOOTH EXTRACTION       Family History  Problem Relation Age of Onset   Alcohol abuse Other    Coronary artery disease Other    Hypertension Other    Emphysema Other    Colon cancer Neg Hx    Pancreatic cancer Neg Hx    Stomach cancer Neg Hx    Rectal cancer Neg Hx    Colon polyps Neg Hx    Esophageal cancer Neg Hx      Objective:  BP 131/72   Pulse 63   Temp 98.2 F (36.8 C)   Ht 5' 8.5 (1.74 m)   Wt 236 lb (107 kg)   SpO2 96%   BMI 35.36 kg/m  He was weighed on the bioimpedance scale: Body mass index is 35.36 kg/m.  Visceral Fat rating : 20, Body Fat %:32.1  Weight Lost Since Last Visit: 0  Weight Gained Since Last Visit: 0    Vitals Temp: 98.2 F (36.8 C) BP: 131/72 Pulse Rate: 63 SpO2: 96 %   Anthropometric Measurements Height: 5' 8.5 (1.74 m) Weight: 236 lb (107 kg) BMI (Calculated): 35.36 Weight at Last Visit: 0 Weight Lost Since Last Visit: 0 Weight Gained Since Last Visit: 0 Starting Weight: 0 Total Weight Loss (lbs): 0 lb (0 kg) Peak Weight: 236 lb Waist Measurement : 0 inches   Body Composition  Body Fat %: 32.1 % Fat Mass (lbs): 75.8 lbs Muscle Mass (lbs): 152.4 lbs Total Body Water (lbs): 106.4 lbs Visceral Fat Rating : 20   Other Clinical  Data Fasting: no Labs: no Today's Visit #: Consult Comments: Consult     General: Well Developed, well nourished, and in no acute distress.  HEENT: Normocephalic, atraumatic; EOMI, sclerae are anicteric. Skin: Warm and dry, good turgor Chest:  Normal excursion, shape, no gross ABN Respiratory: No conversational dyspnea; speaking in full sentences NeuroM-Sk:  Normal gross ROM * 4 extremities  Psych: A and O *3, insight adequate, mood- full    Assessment and Plan:   FOR THE DISEASE OF OBESITY:  BMI 35.0-35.9,adult- Consult BMI: 35.36 Assessment & Plan: We reviewed anthropometrics, biometrics, associated medical conditions and contributing factors with patient. [name] would benefit from a medically tailored reduced calorie nutrional plan based on their REE (resting energy expenditure), which will be determined by indirect calorimetry.  We will also assess for cardiometabolic risk and nutritional derangements via fasting labs at intake appointment.    Obesity Treatment / Action Plan:   he was weighed on the bioimpedance scale and results were discussed and documented in the synopsis.   Nicholas Moore will complete provided nutritional and psychosocial assessment questionnaire before the next appointment.  he will be scheduled for indirect calorimetry to determine resting energy expenditure in a fasting state.  This will allow us  to create a reduced calorie, high-protein meal plan to promote loss of fat mass while preserving muscle mass.  We will also assess for cardiometabolic risk and nutritional derangements via an ECG and fasting serologies at his next appointment.  he was encouraged to work on amassing support from family and friends to begin their weight loss journey.   Work on eliminating or reducing the presence of highly processed, poorly nutritious, calorie-dense foods in the home.   Obesity Education Performed Today:  Patient was counseled on nutritional approaches to  weight loss and benefits of reducing processed foods and consuming plant-based foods and high quality protein as part of nutritional weight management program.   We discussed the importance of long term lifestyle changes which include nutrition, exercise and behavioral modifications as well as the importance of customizing this to his specific health and social needs.   We discussed the benefits of reaching a healthier weight to alleviate the symptoms of existing conditions and reduce the risks of the biomechanical, metabolic and psychological effects of obesity.  Was counseled on the health benefits of losing 5%-10% of total body weight.  Was counseled on our cognitive behavorial therapy program, lead by our bariatric psychologist, who focuses on emotional eating and creating positive behavorial change.  Was counseled on bariatric pharmacotherapy and how this may be used as an adjunct in their weight management    Nicholas Moore appears to be  in the action stage of change and states they are ready to start intensive lifestyle modifications and behavioral modifications.  It was recommended that he follow up in the next 1-2 weeks to review the above steps, and to continue with treatment of their chronic disease state of obesity   FOR OTHER CONDITIONS RELATED TO THE DISEASE OF OBESITY:    Dyslipidemia Assessment & Plan Lab Results  Component Value Date   CHOL 149 11/29/2023   HDL 40.80 11/29/2023   LDLCALC 88 11/29/2023   TRIG 102.0 11/29/2023   CHOLHDL 4 11/29/2023  Has a Fhx of early coronary artery disease. Currently on Lipitor 40 mg once daily. Has been on medication for about 20 years. Good tolerance and compliance of medication. If pt decides to join our program, he will be encouraged to follow a meal plan that is is low in saturated and trans fats, and low in fatty carbs. He will also be encouraged to gradually increase exercise to 300-450 minutes per week.    Severe sleep  apnea Assessment & Plan Diagnosed with sleep apnea years ago. Reports he is compliant with CPAP. If pt decides to join our program he will learn sleep hygiene techniques, stress management strategies, and be encouraged to gradually increase exercise to 300-450 minutes per week to help improve sleep quality and sleep duration.       Attestations:   LILLETTE Feliciano Mingle, acting as a medical scribe for Nicholas Jenkins, DO., have compiled all relevant documentation for today's office visit on behalf of Nicholas Jenkins, DO, while in the presence of [employer], DO.  I have spent 51 minutes in the care of the patient today.  43 minutes was spent in face to face counseling of the patient on the disease of obesity and what our program can do for their medical conditions as well as in preventing future diseases. I discussed the importance of comprehensive care in the treatment of obesity including mental well being and physical activity. 8 minutes was spent on pre-chart review and additional post visit documentation.   I have reviewed the above documentation for accuracy and completeness, and I agree with the above. Nicholas JINNY Moore, D.O.  The 21st Century Cures Act was signed into law in 2016 which includes the topic of electronic health records.  This provides immediate access to information in MyChart.  This includes consultation notes, operative notes, office notes, lab results and pathology reports.  If you have any questions about what you read please let us  know at your next visit so we can discuss your concerns and take corrective action if need be.  We are right here with you!     [1] No Known Allergies  "

## 2025-02-19 ENCOUNTER — Ambulatory Visit (INDEPENDENT_AMBULATORY_CARE_PROVIDER_SITE_OTHER): Admitting: Family Medicine

## 2025-03-05 ENCOUNTER — Ambulatory Visit (INDEPENDENT_AMBULATORY_CARE_PROVIDER_SITE_OTHER): Admitting: Family Medicine
